# Patient Record
Sex: Female | Born: 1970 | Race: White | Hispanic: No | Marital: Married | Smoking: Former smoker
Health system: Southern US, Community
[De-identification: ages and names within clinical notes are randomized; demographics above are authoritative.]

## PROBLEM LIST (undated history)

## (undated) HISTORY — PX: OTHER SURGICAL HISTORY: SHX169

---

## 2000-08-21 ENCOUNTER — Encounter (INDEPENDENT_AMBULATORY_CARE_PROVIDER_SITE_OTHER): Payer: Self-pay | Admitting: Specialist

## 2000-08-21 ENCOUNTER — Inpatient Hospital Stay (HOSPITAL_COMMUNITY): Admission: AD | Admit: 2000-08-21 | Discharge: 2000-08-23 | Payer: Self-pay | Admitting: Obstetrics and Gynecology

## 2000-10-02 ENCOUNTER — Other Ambulatory Visit: Admission: RE | Admit: 2000-10-02 | Discharge: 2000-10-02 | Payer: Self-pay | Admitting: Obstetrics and Gynecology

## 2007-10-23 ENCOUNTER — Ambulatory Visit: Payer: Self-pay | Admitting: Family Medicine

## 2007-10-23 ENCOUNTER — Encounter: Payer: Self-pay | Admitting: Family Medicine

## 2007-10-23 ENCOUNTER — Other Ambulatory Visit: Admission: RE | Admit: 2007-10-23 | Discharge: 2007-10-23 | Payer: Self-pay | Admitting: Family Medicine

## 2007-10-28 LAB — CONVERTED CEMR LAB: Pap Smear: NORMAL

## 2007-11-04 ENCOUNTER — Ambulatory Visit: Payer: Self-pay | Admitting: Family Medicine

## 2007-11-04 DIAGNOSIS — L255 Unspecified contact dermatitis due to plants, except food: Secondary | ICD-10-CM | POA: Insufficient documentation

## 2008-11-04 ENCOUNTER — Other Ambulatory Visit: Admission: RE | Admit: 2008-11-04 | Discharge: 2008-11-04 | Payer: Self-pay | Admitting: Family Medicine

## 2008-11-04 ENCOUNTER — Ambulatory Visit: Payer: Self-pay | Admitting: Family Medicine

## 2008-11-04 ENCOUNTER — Encounter: Payer: Self-pay | Admitting: Family Medicine

## 2008-11-04 DIAGNOSIS — L708 Other acne: Secondary | ICD-10-CM

## 2009-07-17 ENCOUNTER — Ambulatory Visit: Payer: Self-pay | Admitting: Family Medicine

## 2009-07-17 DIAGNOSIS — J069 Acute upper respiratory infection, unspecified: Secondary | ICD-10-CM | POA: Insufficient documentation

## 2009-12-14 ENCOUNTER — Telehealth (INDEPENDENT_AMBULATORY_CARE_PROVIDER_SITE_OTHER): Payer: Self-pay | Admitting: *Deleted

## 2009-12-20 ENCOUNTER — Other Ambulatory Visit: Admission: RE | Admit: 2009-12-20 | Discharge: 2009-12-20 | Payer: Self-pay | Admitting: Family Medicine

## 2009-12-20 ENCOUNTER — Ambulatory Visit: Payer: Self-pay | Admitting: Family Medicine

## 2009-12-20 DIAGNOSIS — D692 Other nonthrombocytopenic purpura: Secondary | ICD-10-CM | POA: Insufficient documentation

## 2010-01-18 ENCOUNTER — Encounter: Admission: RE | Admit: 2010-01-18 | Discharge: 2010-01-18 | Payer: Self-pay | Admitting: Family Medicine

## 2010-09-22 NOTE — Assessment & Plan Note (Signed)
Summary: CPE with pap   Vital Signs:  Patient profile:   40 year old female Menstrual status:  regular LMP:     12/14/2009 Height:      66.25 inches Weight:      148 pounds BMI:     23.79 O2 Sat:      99 % on Room air Pulse rate:   101 / minute BP sitting:   122 / 86  (left arm) Cuff size:   regular  Vitals Entered By: Payton Spark CMA (Dec 20, 2009 3:45 PM)  O2 Flow:  Room air CC: CPE w/ pap LMP (date): 12/14/2009     Enter LMP: 12/14/2009 Last PAP Result NEGATIVE FOR INTRAEPITHELIAL LESIONS OR MALIGNANCY.   Primary Care Provider:  Seymour Bars DO  CC:  CPE w/ pap.  History of Present Illness: 40 yo WF presents for CPE with pap.  On Microgrestin for birth control.  She is G1P1, pre-menopausal.  Doing well.  Married, monogamous.  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  She is due for her first mammogram.  Denies fam hx of cardiovascular dz, colon or breast cancer.  She is due for fasting labs.  Her Tdap was updated in 2009.    Current Medications (verified): 1)  Microgestin 1.5/30 1.5-30 Mg-Mcg  Tabs (Norethindrone Acet-Ethinyl Est) .... Take 1 Tablet By Mouth Once A Day 2)  Childrens Multivitamin 60 Mg Chew (Pediatric Multivit-Minerals-C) .... Take 1 Tablet By Mouth Once A Day  Allergies (verified): No Known Drug Allergies  Past History:  Past Medical History: Reviewed history from 10/23/2007 and no changes required. G1P1  Past Surgical History: Reviewed history from 10/23/2007 and no changes required. none  Family History: Reviewed history from 10/23/2007 and no changes required. mother alive, healthy father died in MVA sister anxiety MGM DM  Social History: Reviewed history from 07/17/2009 and no changes required. Working as Lawyer. Married to Jonny Ruiz and has one daughter, 82 yo.   Quit smoking 2005. Rare ETOH. No regular exercise. 6 caffeine/ day, fair diet  Review of Systems  The patient denies anorexia, fever, weight loss,  weight gain, vision loss, decreased hearing, hoarseness, chest pain, syncope, dyspnea on exertion, peripheral edema, prolonged cough, headaches, hemoptysis, abdominal pain, melena, hematochezia, severe indigestion/heartburn, hematuria, incontinence, genital sores, muscle weakness, suspicious skin lesions, transient blindness, difficulty walking, depression, unusual weight change, abnormal bleeding, enlarged lymph nodes, angioedema, breast masses, and testicular masses.    Physical Exam  General:  alert, well-developed, well-nourished, and well-hydrated.   Head:  normocephalic and atraumatic.   Eyes:  pupils equal, pupils round, and pupils reactive to light.   Ears:  no external deformities.   Nose:  no nasal discharge.   Mouth:  good dentition and pharynx pink and moist.   Neck:  no masses.   Breasts:  No mass, nodules, thickening, tenderness, bulging, retraction, inflamation, nipple discharge or skin changes noted.   Lungs:  Normal respiratory effort, chest expands symmetrically. Lungs are clear to auscultation, no crackles or wheezes. Heart:  Normal rate and regular rhythm. S1 and S2 normal without gallop, murmur, click, rub or other extra sounds. Abdomen:  Bowel sounds positive,abdomen soft and non-tender without masses, organomegaly Genitalia:  Pelvic Exam:        External: normal female genitalia without lesions or masses        Vagina: normal without lesions or masses        Cervix: normal without lesions or masses  Adnexa: normal bimanual exam without masses or fullness        Uterus: normal by palpation        Pap smear: performed Pulses:  2+ radial and pedal pulses Extremities:  no LE edema Skin:  color normal and no rashes.   Cervical Nodes:  No lymphadenopathy noted Psych:  good eye contact, not anxious appearing, and not depressed appearing.     Impression & Recommendations:  Problem # 1:  ROUTINE GYNECOLOGICAL EXAMINATION (ICD-V72.31) Keeping healthy checklist for  women reviewed. BP at goal.  BMI at goal. Thin prep pap today. Update fsting labs. Update mammogram. RFd BCPs.  Complete Medication List: 1)  Microgestin 1.5/30 1.5-30 Mg-mcg Tabs (Norethindrone acet-ethinyl est) .... Take 1 tablet by mouth once a day 2)  Childrens Multivitamin 60 Mg Chew (Pediatric multivit-minerals-c) .... Take 1 tablet by mouth once a day  Other Orders: T-Mammography Bilateral Screening (16109) T-CBC No Diff ([id number]) T-Comprehensive Metabolic Panel ([id number]) T-Lipid Profile ([id number]) Prescriptions: MICROGESTIN 1.5/30 1.5-30 MG-MCG  TABS (NORETHINDRONE ACET-ETHINYL EST) Take 1 tablet by mouth once a day  #28 Tablet x 12   Entered and Authorized by:   Seymour Bars DO   Signed by:   Seymour Bars DO on 12/20/2009   Method used:   [facility]ronically to        [phone] (retail)       [address]       [place], [place]  [address]       Ph: [phone] or [phone]       Fax: [phone]   RxID:   [id number]

## 2010-09-22 NOTE — Progress Notes (Signed)
Summary: refill request for Birth Control  Phone Note Refill Request Message from:  Patient on December 14, 2009 11:20 AM  Refills Requested: Medication #1:  MICROGESTIN 1.5/30 1.5-30 MG-MCG  TABS Take 1 tablet by mouth once a day   Dosage confirmed as above?Dosage Confirmed [facility], she is coming for her pap on 12/20/09 but runs out of birth control before then. Thanks  Initial call taken by: Michaelle Copas,  December 14, 2009 11:21 AM    Prescriptions: MICROGESTIN 1.5/30 1.5-30 MG-MCG  TABS (NORETHINDRONE ACET-ETHINYL EST) Take 1 tablet by mouth once a day  #28 Tablet x 0   Entered by:   Payton Spark CMA   Authorized by:   Seymour Bars DO   Signed by:   Payton Spark CMA on 12/14/2009   Method used:   Electronically to        [facility] [phone]* (retail)       [address]       [place], [place]  [address]       Ph: [phone] or [phone]       Fax: [phone]   RxID:   [id number]

## 2010-12-19 ENCOUNTER — Other Ambulatory Visit: Payer: Self-pay | Admitting: Family Medicine

## 2010-12-19 NOTE — Telephone Encounter (Signed)
Pt called and wants refill of OCP pill. PLAN:  Pt notified and told needs to come in for CPE.  LMOM to call and schedule, and cannot refill any OCp med until done.

## 2011-02-07 ENCOUNTER — Encounter: Payer: Self-pay | Admitting: Family Medicine

## 2011-02-08 ENCOUNTER — Encounter: Payer: Self-pay | Admitting: Family Medicine

## 2011-03-01 ENCOUNTER — Ambulatory Visit (INDEPENDENT_AMBULATORY_CARE_PROVIDER_SITE_OTHER): Payer: BC Managed Care – PPO | Admitting: Family Medicine

## 2011-03-01 ENCOUNTER — Encounter: Payer: Self-pay | Admitting: Family Medicine

## 2011-03-01 DIAGNOSIS — Z1329 Encounter for screening for other suspected endocrine disorder: Secondary | ICD-10-CM

## 2011-03-01 DIAGNOSIS — Z8349 Family history of other endocrine, nutritional and metabolic diseases: Secondary | ICD-10-CM

## 2011-03-01 DIAGNOSIS — Z0000 Encounter for general adult medical examination without abnormal findings: Secondary | ICD-10-CM

## 2011-03-01 DIAGNOSIS — Z1231 Encounter for screening mammogram for malignant neoplasm of breast: Secondary | ICD-10-CM

## 2011-03-01 DIAGNOSIS — Z13220 Encounter for screening for lipoid disorders: Secondary | ICD-10-CM

## 2011-03-01 MED ORDER — NORETHINDRONE ACET-ETHINYL EST 1.5-30 MG-MCG PO TABS: ORAL_TABLET | ORAL | Status: DC

## 2011-03-01 NOTE — Progress Notes (Signed)
  Subjective:    Patient ID: Mary Fowler, female    DOB: [birth year], 40 y.o.   MRN: [id number]  HPI  39 yo WF presents for CPE with pap smear.  She is on Junel for birth control.  Having some hot flashes and nightsweats.  Regular menses.  Her tetanus vaccine is UTD.  Her mammogram is due.  No fam hx of premature heart dz or colon cancer.  She does not smoke.  No fam hx of breast cancer.  She takes a MVI.  She stays physically active.  Due to RF her OCP.  Due for fasting labs.  Had a normal pap smear last year.  No hx of abnormals.  Due to repeat next yr.    BP 150/85  Pulse 100  Ht 5' 6.25" (1.683 m)  Wt 144 lb (65.318 kg)  BMI 23.07 kg/m2  SpO2 99%  LMP 02/19/2011   Review of Systems  Constitutional: Negative for fatigue and unexpected weight change.  Eyes: Negative for visual disturbance.  Respiratory: Negative for shortness of breath.   Cardiovascular: Negative for chest pain, palpitations and leg swelling.  Gastrointestinal: Negative for abdominal pain, diarrhea, constipation and blood in stool.  Genitourinary: Negative for dysuria and vaginal discharge.  Neurological: Negative for headaches.  Psychiatric/Behavioral: Negative for sleep disturbance and dysphoric mood.       Objective:   Physical Exam  Constitutional: She appears well-developed and well-nourished. No distress.  HENT:  Mouth/Throat: Oropharynx is clear and moist.  Eyes: Conjunctivae are normal.  Neck: Neck supple. No thyromegaly present.  Cardiovascular: Normal rate, regular rhythm and normal heart sounds.   No murmur heard. Pulmonary/Chest: Effort normal and breath sounds normal.  Abdominal: Soft. Bowel sounds are normal. She exhibits no distension. There is no tenderness. There is no guarding.       No HSM  Musculoskeletal: She exhibits no edema.  Skin: Skin is warm.  Psychiatric: She has a normal mood and affect.          Assessment & Plan:  Assesment:  1. CPE- Keeping healthy checklist for  women reviewed today.  BP at goal.  BMI 23  in the normal range.     Labs ordered even though she is nonfasting. Contraception- continue Junel, RFd. Last pap 12-2009, recheck in 1 yr. Tdap is UTD. Mammogram ordered. Encouraged healthy diet, regular exercise, MVI daily.  Add Calcium with D daily. Return for next physical in 1 yr.

## 2011-03-01 NOTE — Patient Instructions (Signed)
Labs due.  Check today. Will call you w/ results tomorrow.  Return for f/u in 1 yr, sooner if needed.  Call [phone] and ask for Kathryne Sharper to update your mammogram.

## 2011-03-02 ENCOUNTER — Telehealth: Payer: Self-pay | Admitting: Family Medicine

## 2011-03-02 LAB — COMPLETE METABOLIC PANEL WITH GFR
AST: 14 U/L (ref 0–37)
Albumin: 4.5 g/dL (ref 3.5–5.2)
Alkaline Phosphatase: 34 U/L — ABNORMAL LOW (ref 39–117)
BUN: 7 mg/dL (ref 6–23)
CO2: 26 mEq/L (ref 19–32)
Creat: 0.84 mg/dL (ref 0.50–1.10)
GFR, Est African American: >60 mL/min (ref >60)
Glucose, Bld: 100 mg/dL — ABNORMAL HIGH (ref 70–99)

## 2011-03-02 LAB — TSH: TSH: 1.892 u[IU]/mL (ref 0.350–4.500)

## 2011-03-02 NOTE — Telephone Encounter (Signed)
Pt aware of the above  

## 2011-03-02 NOTE — Telephone Encounter (Signed)
Pls let pt know that her sugar, liver and kidney function came back normal.  Her LDL bad cholesterol (was non fasting) was high at 157.  I'd recommend trying 2 grams of Omega 3 Fish Oil/ day and regular exercise and repeating a full cholesterol panel in 3-4 mos.

## 2011-03-09 ENCOUNTER — Other Ambulatory Visit: Payer: Self-pay | Admitting: Family Medicine

## 2011-05-02 ENCOUNTER — Inpatient Hospital Stay: Admission: RE | Admit: 2011-05-02 | Payer: BC Managed Care – PPO | Source: Ambulatory Visit

## 2011-05-02 ENCOUNTER — Ambulatory Visit
Admission: RE | Admit: 2011-05-02 | Discharge: 2011-05-02 | Disposition: A | Discharge status: Home / Self Care | Payer: BC Managed Care – PPO | Source: Ambulatory Visit | Attending: Family Medicine | Admitting: Family Medicine

## 2011-05-02 DIAGNOSIS — Z1231 Encounter for screening mammogram for malignant neoplasm of breast: Secondary | ICD-10-CM

## 2012-02-07 ENCOUNTER — Other Ambulatory Visit: Payer: Self-pay | Admitting: *Deleted

## 2012-02-07 MED ORDER — NORETHINDRONE ACET-ETHINYL EST 1.5-30 MG-MCG PO TABS: 1.0000 | ORAL_TABLET | Freq: Every day | ORAL | Status: DC

## 2012-03-11 ENCOUNTER — Other Ambulatory Visit: Payer: Self-pay | Admitting: Family Medicine

## 2012-03-12 ENCOUNTER — Other Ambulatory Visit: Payer: Self-pay | Admitting: *Deleted

## 2012-03-12 MED ORDER — NORETHINDRONE ACET-ETHINYL EST 1.5-30 MG-MCG PO TABS: 1.0000 | ORAL_TABLET | Freq: Every day | ORAL | Status: DC

## 2012-04-01 ENCOUNTER — Encounter: Payer: BC Managed Care – PPO | Admitting: Family Medicine

## 2012-04-05 ENCOUNTER — Encounter: Payer: Self-pay | Admitting: Family Medicine

## 2012-04-05 ENCOUNTER — Other Ambulatory Visit (HOSPITAL_COMMUNITY)
Admission: RE | Admit: 2012-04-05 | Discharge: 2012-04-05 | Disposition: A | Discharge status: Home / Self Care | Payer: BC Managed Care – PPO | Source: Ambulatory Visit | Attending: Family Medicine | Admitting: Family Medicine

## 2012-04-05 ENCOUNTER — Ambulatory Visit (INDEPENDENT_AMBULATORY_CARE_PROVIDER_SITE_OTHER): Payer: BC Managed Care – PPO | Admitting: Family Medicine

## 2012-04-05 VITALS — BP 135/90 | HR 85 | Wt 146.0 lb

## 2012-04-05 DIAGNOSIS — E785 Hyperlipidemia, unspecified: Secondary | ICD-10-CM

## 2012-04-05 DIAGNOSIS — Z1231 Encounter for screening mammogram for malignant neoplasm of breast: Secondary | ICD-10-CM

## 2012-04-05 DIAGNOSIS — Z01419 Encounter for gynecological examination (general) (routine) without abnormal findings: Secondary | ICD-10-CM

## 2012-04-05 DIAGNOSIS — Z1151 Encounter for screening for human papillomavirus (HPV): Secondary | ICD-10-CM | POA: Insufficient documentation

## 2012-04-05 DIAGNOSIS — E782 Mixed hyperlipidemia: Secondary | ICD-10-CM | POA: Insufficient documentation

## 2012-04-05 MED ORDER — NORETHINDRONE ACET-ETHINYL EST 1.5-30 MG-MCG PO TABS: ORAL_TABLET | ORAL | Status: DC

## 2012-04-05 NOTE — Patient Instructions (Addendum)
Start a regular exercise program and make sure you are eating a healthy diet Try to eat 4 servings of dairy a day Tdap given today.

## 2012-04-05 NOTE — Progress Notes (Signed)
  Subjective:     Mary Fowler is a 41 y.o. female and is here for a comprehensive physical exam. The patient reports no problems.  History   Social History  . Marital Status: Married    Spouse Name: N/A    Number of Children: 1  . Years of Education: N/A   Occupational History  . realestate paralegal.      Works on [employer] for [employer].    Social History Main Topics  . Smoking status: Former Smoker    Quit date: 08/22/2003  . Smokeless tobacco: Not on file  . Alcohol Use: Yes     rare  . Drug Use: No  . 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   Other Topics Concern  . Not on file   Social History Narrative   Walks for exercise. Swimming.     Health Maintenance  Topic Date Due  . Tetanus/tdap  08/22/2011  . Influenza Vaccine  05/21/2012  . Pap Smear  04/06/2015    The following portions of the patient's history were reviewed and updated as appropriate: allergies, current medications, past family history, past medical history, past social history, past surgical history and problem list.  Review of Systems A comprehensive review of systems was negative.   Objective:    BP 135/90  Pulse 85  Wt 146 lb (66.225 kg)  SpO2 96%  LMP 03/22/2012 General appearance: alert, cooperative and appears stated age Head: Normocephalic, without obvious abnormality, atraumatic Eyes: conj clear, EOMi, PEERLA Ears: normal TM's and external ear canals both ears Nose: Nares normal. Septum midline. Mucosa normal. No drainage or sinus tenderness. Throat: lips, mucosa, and tongue normal; teeth and gums normal Neck: no adenopathy, no carotid bruit, supple, symmetrical, trachea midline and thyroid not enlarged, symmetric, no tenderness/mass/nodules Back: symmetric, no curvature. ROM normal. No CVA tenderness. Lungs: clear to auscultation bilaterally Breasts: normal appearance, no masses or tenderness, breast  ridge on the right is more prominent then on the left.   Heart: regular rate and rhythm, S1, S2 normal, no murmur, click, rub or gallop Abdomen: soft, non-tender; bowel sounds normal; no masses,  no organomegaly Pelvic: cervix normal in appearance, external genitalia normal, no adnexal masses or tenderness, no cervical motion tenderness, rectovaginal septum normal, uterus normal size, shape, and consistency and vagina normal without discharge Extremities: extremities normal, atraumatic, no cyanosis or edema Pulses: 2+ and symmetric Skin: Skin color, texture, turgor normal. No rashes or lesions Lymph nodes: Cervical, supraclavicular, and axillary nodes normal. Neurologic: Grossly normal    Assessment:    Healthy female exam.      Plan:     See After Visit Summary for Counseling Recommendations  Start a regular exercise program and make sure you are eating a healthy diet Try to eat 4 servings of dairy a day or take a calcium supplement (500mg  twice a day). Your vaccines are up to date.  Due for Tdap today Due for screening mammogram Due for CMp, lipids. Will check TSH bc of family hx.

## 2012-05-07 ENCOUNTER — Ambulatory Visit: Payer: BC Managed Care – PPO

## 2012-05-07 ENCOUNTER — Telehealth: Payer: Self-pay | Admitting: Family Medicine

## 2012-05-07 DIAGNOSIS — N63 Unspecified lump in unspecified breast: Secondary | ICD-10-CM

## 2012-05-07 NOTE — Telephone Encounter (Signed)
Order placed

## 2012-05-07 NOTE — Telephone Encounter (Signed)
Imaging downstairs called and states that this patient needs a diagnostic mammo order and it will have to be done at [facility] [place].[name] Thanks, Victorino Dike

## 2012-05-08 ENCOUNTER — Other Ambulatory Visit: Payer: Self-pay | Admitting: Family Medicine

## 2012-05-08 DIAGNOSIS — N63 Unspecified lump in unspecified breast: Secondary | ICD-10-CM

## 2012-05-29 ENCOUNTER — Ambulatory Visit
Admission: RE | Admit: 2012-05-29 | Discharge: 2012-05-29 | Disposition: A | Discharge status: Home / Self Care | Payer: BC Managed Care – PPO | Source: Ambulatory Visit | Attending: Family Medicine | Admitting: Family Medicine

## 2012-05-29 DIAGNOSIS — N63 Unspecified lump in unspecified breast: Secondary | ICD-10-CM

## 2017-10-20 ENCOUNTER — Emergency Department (INDEPENDENT_AMBULATORY_CARE_PROVIDER_SITE_OTHER)
Admission: EM | Admit: 2017-10-20 | Discharge: 2017-10-20 | Disposition: A | Discharge status: Home / Self Care | Payer: No Typology Code available for payment source | Source: Home / Self Care | Attending: Family Medicine | Admitting: Family Medicine

## 2017-10-20 ENCOUNTER — Encounter: Payer: Self-pay | Admitting: Emergency Medicine

## 2017-10-20 DIAGNOSIS — L237 Allergic contact dermatitis due to plants, except food: Secondary | ICD-10-CM | POA: Diagnosis not present

## 2017-10-20 DIAGNOSIS — L255 Unspecified contact dermatitis due to plants, except food: Secondary | ICD-10-CM

## 2017-10-20 MED ORDER — TRIAMCINOLONE ACETONIDE 40 MG/ML IJ SUSP
40.0000 mg | Freq: Once | INTRAMUSCULAR | Status: AC
  Administered 2017-10-20: 40 mg via INTRAMUSCULAR

## 2017-10-20 NOTE — Discharge Instructions (Signed)
May continue Benadryl at bedtime for itching.  May take nonsedating antihistamine such as Zyrtec for itchng daytime.  May apply 1% hydrocortisone cream 2 or 3 times daily.

## 2017-10-20 NOTE — ED Provider Notes (Signed)
[facility]    CSN: [id number] Arrival date & time: 10/20/17  0943     History   Chief Complaint Chief Complaint  Patient presents with  . Rash    HPI Mary Fowler is a 47 y.o. female.   Patient came into contact with poison ivy vines about 6 days ago, and subsequently developed pruritic rash on her right neck and upper chest.   The history is provided by the patient.  Rash  Location: Neck and upper chest. Quality: dryness, itchiness, redness and swelling   Quality: not blistering, not bruising, not burning, not draining, not painful, not peeling and not weeping   Severity:  Mild Onset quality:  Sudden Duration:  6 days Timing:  Constant Progression:  Worsening Chronicity:  New Context: plant contact   Relieved by:  None tried Worsened by:  Heat Ineffective treatments:  None tried Associated symptoms: no fatigue, no fever, no induration, no joint pain, no myalgias, no nausea and no sore throat     History reviewed. No pertinent past medical history.  Patient Active Problem List   Diagnosis Date Noted  . Other and unspecified hyperlipidemia 04/05/2012  . 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 ACNE VULGARIS 11/04/2008    History reviewed. No pertinent surgical history.  OB History    No data available       Home Medications    Prior to Admission medications   Not on File    Family History Family History  Problem Relation Age of Onset  . Hypothyroidism Mother   . Hyperlipidemia Mother   . Anxiety disorder Sister   . Diabetes Maternal Grandmother     Social History Social History   Tobacco Use  . Smoking status: Former Smoker    Last attempt to quit: 08/22/2003    Years since quitting: 14.1  . Smokeless tobacco: Never Used  Substance Use Topics  . Alcohol use: Yes    Comment: rare  . Drug use: No     Allergies   Patient has no known allergies.   Review of Systems Review of Systems  Constitutional: Negative  for fatigue and fever.  HENT: Negative for sore throat.   Gastrointestinal: Negative for nausea.  Musculoskeletal: Negative for arthralgias and myalgias.  Skin: Positive for rash.     Physical Exam Triage Vital Signs ED Triage Vitals [10/20/17 1005]  Enc Vitals Group     BP 127/87     Pulse Rate 82     Resp      Temp 98.2 F (36.8 C)     Temp Source Oral     SpO2 97 %     Weight 153 lb (69.4 kg)     Height 5\' 7"  (1.702 m)     Head Circumference      Peak Flow      Pain Score 0     Pain Loc      Pain Edu?      Excl. in GC?    No data found.  Updated Vital Signs BP 127/87 (BP Location: Right Arm)   Pulse 82   Temp 98.2 F (36.8 C) (Oral)   Ht 5\' 7"  (1.702 m)   Wt 153 lb (69.4 kg)   LMP 10/09/2017 (Exact Date)   SpO2 97%   BMI 23.96 kg/m   Visual Acuity Right Eye Distance:   Left Eye Distance:   Bilateral Distance:    Right Eye Near:   Left Eye Near:  Bilateral Near:     Physical Exam  Constitutional: She appears well-developed and well-nourished. No distress.  HENT:  Head: Normocephalic.  Right Ear: External ear normal.  Left Ear: External ear normal.  Nose: Nose normal.  Mouth/Throat: Oropharynx is clear and moist.  Eyes: Conjunctivae are normal. Pupils are equal, round, and reactive to light.  Neck: Normal range of motion.    Erythematous maculo-papular eruption right neck and upper chest consistent with rhus dermatitis.  No evidence cellulitis.  Cardiovascular: Normal rate.  Pulmonary/Chest: Effort normal.  Neurological: She is alert.  Skin: Skin is warm and dry.     Nursing note and vitals reviewed.    UC Treatments / Results  Labs (all labs ordered are listed, but only abnormal results are displayed) Labs Reviewed - No data to display  EKG  EKG Interpretation None       Radiology No results found.  Procedures Procedures (including critical care time)  Medications Ordered in UC Medications  triamcinolone acetonide  (KENALOG-40) injection 40 mg (not administered)     Initial Impression / Assessment and Plan / UC Course  I have reviewed the triage vital signs and the nursing notes.  Pertinent labs & imaging results that were available during my care of the patient were reviewed by me and considered in my medical decision making (see chart for details).    Administered Kenalog 40mg  IM. May continue Benadryl at bedtime for itching.  May take nonsedating antihistamine such as Zyrtec for itchng daytime.  May apply 1% hydrocortisone cream 2 or 3 times daily. Followup with Family Doctor if not improved in about 5 days.    Final Clinical Impressions(s) / UC Diagnoses   Final diagnoses:  Rhus dermatitis    ED Discharge Orders    None           Lattie Haw, MD 10/20/17 1017

## 2017-10-20 NOTE — ED Triage Notes (Signed)
Patient complaining of possible poison oak on neck and chest x 3 days, itching.

## 2018-10-22 ENCOUNTER — Emergency Department
Admission: EM | Admit: 2018-10-22 | Discharge: 2018-10-22 | Disposition: A | Discharge status: Home / Self Care | Payer: No Typology Code available for payment source | Source: Home / Self Care

## 2018-10-22 ENCOUNTER — Other Ambulatory Visit: Payer: Self-pay

## 2018-10-22 ENCOUNTER — Encounter: Payer: Self-pay | Admitting: Emergency Medicine

## 2018-10-22 DIAGNOSIS — J029 Acute pharyngitis, unspecified: Secondary | ICD-10-CM | POA: Diagnosis not present

## 2018-10-22 DIAGNOSIS — J069 Acute upper respiratory infection, unspecified: Secondary | ICD-10-CM

## 2018-10-22 DIAGNOSIS — B9789 Other viral agents as the cause of diseases classified elsewhere: Secondary | ICD-10-CM

## 2018-10-22 LAB — POCT INFLUENZA A/B
Influenza A, POC: NEGATIVE
Influenza B, POC: NEGATIVE

## 2018-10-22 LAB — POCT RAPID STREP A (OFFICE): Rapid Strep A Screen: NEGATIVE

## 2018-10-22 MED ORDER — AZITHROMYCIN 250 MG PO TABS: 250.0000 mg | ORAL_TABLET | Freq: Every day | ORAL | 0 refills | Status: DC

## 2018-10-22 NOTE — Discharge Instructions (Addendum)
°  Your symptoms are likely due to a virus such as the common cold, however, if you developing worsening chest congestion with shortness of breath, persistent fever for 3 days, or symptoms not improving in 4-5 days, you may fill the antibiotic (azithromycin).  If you do fill the antibiotic,  please take antibiotics as prescribed and be sure to complete entire course even if you start to feel better to ensure infection does not come back. You may take 500mg  acetaminophen every 4-6 hours or in combination with ibuprofen 400-600mg  every 6-8 hours as needed for pain, inflammation, and fever.  Be sure to well hydrated with clear liquids and get at least 8 hours of sleep at night, preferably more while sick.   Please follow up with family medicine in 1 week if needed.

## 2018-10-22 NOTE — ED Provider Notes (Signed)
[facility]    CSN: [id number] Arrival date & time: 10/22/18  1427     History   Chief Complaint Chief Complaint  Patient presents with  . Headache    HPI Mary Fowler is a 48 y.o. female.   HPI  Mary Fowler is a 48 y.o. female presenting to UC with c/o generalized HA, subjective fever, night sweats and body aches since yesterday.  Associated sore throat with mild cough and mild congestion. She was exposed to the flu at work by several co-workers 1-2 weeks ago.  She did not receive the flu vaccine.  She would like to be tested for flu, she does not want to try Tamiflu unless she tests positive.  She has taken ibuprofen, last dose at 1PM. Mild relief. Denies chest pain or SOB.    History reviewed. No pertinent past medical history.  Patient Active Problem List   Diagnosis Date Noted  . Other and unspecified hyperlipidemia 04/05/2012  . 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 ACNE VULGARIS 11/04/2008    History reviewed. No pertinent surgical history.  OB History   No obstetric history on file.      Home Medications    Prior to Admission medications   Medication Sig Start Date End Date Taking? Authorizing Provider  ibuprofen (ADVIL,MOTRIN) 800 MG tablet Take 800 mg by mouth every 8 (eight) hours as needed.   Yes [provider]  azithromycin (ZITHROMAX) 250 MG tablet Take 1 tablet (250 mg total) by mouth daily. Take first 2 tablets together, then 1 every day until finished. 10/22/18   Lurene ShadowPhelps, Debe Anfinson O, PA-C    Family History Family History  Problem Relation Age of Onset  . Hypothyroidism Mother   . Hyperlipidemia Mother   . Anxiety disorder Sister   . Diabetes Maternal Grandmother     Social History Social History   Tobacco Use  . Smoking status: Former Smoker    Last attempt to quit: 08/22/2003    Years since quitting: 15.1  . Smokeless tobacco: Never Used  Substance Use Topics  . Alcohol use: Yes    Comment: rare  . Drug  use: No     Allergies   Patient has no known allergies.   Review of Systems Review of Systems  Constitutional: Positive for chills, diaphoresis, fatigue and fever.  HENT: Positive for congestion and sore throat. Negative for ear pain, trouble swallowing and voice change.   Respiratory: Positive for cough. Negative for shortness of breath.   Cardiovascular: Negative for chest pain and palpitations.  Gastrointestinal: Negative for abdominal pain, diarrhea, nausea and vomiting.  Musculoskeletal: Negative for arthralgias, back pain and myalgias.  Skin: Negative for rash.  Neurological: Positive for headaches. Negative for dizziness and light-headedness.     Physical Exam Triage Vital Signs ED Triage Vitals  Enc Vitals Group     BP 10/22/18 1447 125/87     Pulse Rate 10/22/18 1447 (!) 110     Resp --      Temp 10/22/18 1447 100.3 F (37.9 C)     Temp Source 10/22/18 1447 Oral     SpO2 10/22/18 1447 96 %     Weight 10/22/18 1448 160 lb (72.6 kg)     Height 10/22/18 1448 5\' 7"  (1.702 m)     Head Circumference --      Peak Flow --      Pain Score 10/22/18 1448 6     Pain Loc --  Pain Edu? --      Excl. in GC? --    No data found.  Updated Vital Signs BP 125/87 (BP Location: Right Arm)   Pulse (!) 110   Temp 100.3 F (37.9 C) (Oral)   Ht 5\' 7"  (1.702 m)   Wt 160 lb (72.6 kg)   SpO2 96%   BMI 25.06 kg/m   Visual Acuity Right Eye Distance:   Left Eye Distance:   Bilateral Distance:    Right Eye Near:   Left Eye Near:    Bilateral Near:     Physical Exam Vitals signs and nursing note reviewed.  Constitutional:      Appearance: She is well-developed.  HENT:     Head: Normocephalic and atraumatic.     Right Ear: Tympanic membrane normal.     Left Ear: Tympanic membrane normal.     Nose: Nose normal.     Right Sinus: No maxillary sinus tenderness or frontal sinus tenderness.     Left Sinus: No maxillary sinus tenderness or frontal sinus tenderness.      Mouth/Throat:     Lips: Pink.     Mouth: Mucous membranes are moist. Oral lesions present.     Pharynx: Oropharynx is clear. Uvula midline.      Comments: Shallow erythematous sores on roofs of mouth. Neck:     Musculoskeletal: Normal range of motion and neck supple.  Cardiovascular:     Rate and Rhythm: Regular rhythm. Tachycardia present.  Pulmonary:     Effort: Pulmonary effort is normal.     Breath sounds: Normal breath sounds.  Musculoskeletal: Normal range of motion.  Lymphadenopathy:     Cervical: No cervical adenopathy.  Skin:    General: Skin is warm and dry.  Neurological:     Mental Status: She is alert and oriented to person, place, and time.     GCS: GCS eye subscore is 4. GCS verbal subscore is 5. GCS motor subscore is 6.  Psychiatric:        Behavior: Behavior normal.      UC Treatments / Results  Labs (all labs ordered are listed, but only abnormal results are displayed) Labs Reviewed  STREP A DNA PROBE  POCT RAPID STREP A (OFFICE)  POCT INFLUENZA A/B    EKG None  Radiology No results found.  Procedures Procedures (including critical care time)  Medications Ordered in UC Medications - No data to display  Initial Impression / Assessment and Plan / UC Course  I have reviewed the triage vital signs and the nursing notes.  Pertinent labs & imaging results that were available during my care of the patient were reviewed by me and considered in my medical decision making (see chart for details).     Rapid strep and flu: NEGATIVE Pt does have sore on the roof of her mouth but denies pain. Sores are likely related to viral illness causing URI symptoms HA likely from low-grade fever, encouraged good hydration. Discussed ordering CXR today, pt declined.  No hx of pneumonia.  Prescription to hold with expiration date for azithromycin. Pt to fill if persistent fever develops or not improving in 1 week.   Final Clinical Impressions(s) / UC Diagnoses    Final diagnoses:  Sore throat  Viral upper respiratory tract infection with cough     Discharge Instructions      Your symptoms are likely due to a virus such as the common cold, however, if you developing worsening chest congestion with  shortness of breath, persistent fever for 3 days, or symptoms not improving in 4-5 days, you may fill the antibiotic (azithromycin).  If you do fill the antibiotic,  please take antibiotics as prescribed and be sure to complete entire course even if you start to feel better to ensure infection does not come back. You may take 500mg  acetaminophen every 4-6 hours or in combination with ibuprofen 400-600mg  every 6-8 hours as needed for pain, inflammation, and fever.  Be sure to well hydrated with clear liquids and get at least 8 hours of sleep at night, preferably more while sick.   Please follow up with family medicine in 1 week if needed.     ED Prescriptions    Medication Sig Dispense Auth. Provider   azithromycin (ZITHROMAX) 250 MG tablet Take 1 tablet (250 mg total) by mouth daily. Take first 2 tablets together, then 1 every day until finished. 6 tablet Lurene Shadow, PA-C     Controlled Substance Prescriptions [place] Controlled Substance Registry consulted? Not Applicable   Lurene Shadow, PA-C 10/22/18 1640

## 2018-10-22 NOTE — ED Triage Notes (Signed)
Headache, fever, night sweats,body aches, exposure to flu. Started yesterday

## 2018-10-23 ENCOUNTER — Telehealth: Payer: Self-pay | Admitting: *Deleted

## 2018-10-23 LAB — STREP A DNA PROBE: Group A Strep Probe: NOT DETECTED

## 2018-10-23 NOTE — Telephone Encounter (Signed)
Patient given throat culture results. She is improving.

## 2019-07-30 ENCOUNTER — Encounter: Payer: Self-pay | Admitting: Physician Assistant

## 2019-07-30 ENCOUNTER — Other Ambulatory Visit: Payer: Self-pay

## 2019-07-30 ENCOUNTER — Ambulatory Visit (INDEPENDENT_AMBULATORY_CARE_PROVIDER_SITE_OTHER): Payer: No Typology Code available for payment source | Admitting: Physician Assistant

## 2019-07-30 ENCOUNTER — Other Ambulatory Visit (HOSPITAL_COMMUNITY)
Admission: RE | Admit: 2019-07-30 | Discharge: 2019-07-30 | Disposition: A | Discharge status: Home / Self Care | Payer: No Typology Code available for payment source | Source: Ambulatory Visit | Attending: Physician Assistant | Admitting: Physician Assistant

## 2019-07-30 VITALS — BP 123/72 | HR 92 | Ht 66.5 in | Wt 158.0 lb

## 2019-07-30 DIAGNOSIS — N938 Other specified abnormal uterine and vaginal bleeding: Secondary | ICD-10-CM

## 2019-07-30 DIAGNOSIS — Z1231 Encounter for screening mammogram for malignant neoplasm of breast: Secondary | ICD-10-CM

## 2019-07-30 DIAGNOSIS — Z0000 Encounter for general adult medical examination without abnormal findings: Secondary | ICD-10-CM

## 2019-07-30 DIAGNOSIS — Z131 Encounter for screening for diabetes mellitus: Secondary | ICD-10-CM

## 2019-07-30 DIAGNOSIS — Z13220 Encounter for screening for lipoid disorders: Secondary | ICD-10-CM

## 2019-07-30 DIAGNOSIS — Z1211 Encounter for screening for malignant neoplasm of colon: Secondary | ICD-10-CM

## 2019-07-30 LAB — WET PREP FOR TRICH, YEAST, CLUE
MICRO NUMBER:: 1180283
Specimen Quality: ADEQUATE

## 2019-07-30 NOTE — Progress Notes (Signed)
Subjective:     Mary Fowler is a 48 y.o. female and is here for a comprehensive physical exam. The patient reports problems - see below.    Patient comes in today to reestablish care.  She was previously in her office back in 2013 but she has not sought any more medical care since then.  She wishes to get back on track.  She is concerned today with some abnormal uterine bleeding.  She has been having fairly regular periods monthly until November.  She had 2 periods in November and she thought she had stopped on November 20 night.  She started back a few days later and has bled until yesterday.  She has not had any bleeding today.  She denies any abdominal pain or flank pain.  She denies any pain with intercourse.  She denies any vaginal discharge.  She has noticed a bad odor in her vaginal area.  She denies any vaginal itching or burning.  She has not been on birth control since she was 38 and came off the pill.  Social History   Socioeconomic History  . Marital status: Married    Spouse name: Not on file  . Number of children: 1  . Years of education: Not on file  . Highest education level: Not on file  Occupational History  . Occupation: Careers information officerrealestate paralegal.     Employer: [employer]    Comment: Works on [employer] for [employer].   Social Needs  . Financial resource strain: Not on file  . Food insecurity    Worry: Not on file    Inability: Not on file  . Transportation needs    Medical: Not on file    Non-medical: Not on file  Tobacco Use  . Smoking status: Former Smoker    Quit date: 08/21/2001    Years since quitting: 17.9  . Smokeless tobacco: Never Used  Substance and Sexual Activity  . Alcohol use: Yes    Comment: rare  . Drug use: No  . 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  Lifestyle  . Physical activity    Days per week: Not on file    Minutes per  session: Not on file  . Stress: Not on file  Relationships  . Social Musicianconnections    Talks on phone: Not on file    Gets together: Not on file    Attends religious service: Not on file    Active member of club or organization: Not on file    Attends meetings of clubs or organizations: Not on file    Relationship status: Not on file  . Intimate partner violence    Fear of current or ex partner: Not on file    Emotionally abused: Not on file    Physically abused: Not on file    Forced sexual activity: Not on file  Other Topics Concern  . Not on file  Social History Narrative   Walks for exercise. Swimming.     Health Maintenance  Topic Date Due  . HIV Screening  05/25/1984  . PAP SMEAR-Modifier  04/06/2015  . INFLUENZA VACCINE  03/22/2019  . MAMMOGRAM  05/26/2019  . COLONOSCOPY  05/26/2019  . TETANUS/TDAP  04/05/2022    The following portions of the patient's history were reviewed and updated as appropriate: allergies, current medications, past family history, past medical history, past social  history, past surgical history and problem list.  Review of Systems Pertinent items noted in HPI and remainder of comprehensive ROS otherwise negative.   Objective:    BP 123/72   Pulse 92   Ht 5' 6.5" (1.689 m)   Wt 158 lb (71.7 kg)   SpO2 99%   BMI 25.12 kg/m  General appearance: alert, cooperative and appears stated age Head: Normocephalic, without obvious abnormality, atraumatic Eyes: conjunctivae/corneas clear. PERRL, EOM's intact. Fundi benign. Ears: normal TM's and external ear canals both ears Nose: Nares normal. Septum midline. Mucosa normal. No drainage or sinus tenderness. Throat: lips, mucosa, and tongue normal; teeth and gums normal Neck: no adenopathy, no carotid bruit, no JVD, supple, symmetrical, trachea midline and thyroid not enlarged, symmetric, no tenderness/mass/nodules Back: symmetric, no curvature. ROM normal. No CVA tenderness. Lungs: clear to  auscultation bilaterally Breasts: normal appearance, no masses or tenderness Heart: regular rate and rhythm, S1, S2 normal, no murmur, click, rub or gallop Abdomen: soft, non-tender; bowel sounds normal; no masses,  no organomegaly Pelvic: cervix normal in appearance, external genitalia normal, no adnexal masses or tenderness, no cervical motion tenderness, uterus normal size, shape, and consistency and vagina normal without discharge Extremities: extremities normal, atraumatic, no cyanosis or edema Pulses: 2+ and symmetric Skin: Skin color, texture, turgor normal. No rashes or lesions Lymph nodes: Cervical, supraclavicular, and axillary nodes normal. Neurologic: Alert and oriented X 3, normal strength and tone. Normal symmetric reflexes. Normal coordination and gait  hyper reflexive over left patella.    Assessment:    Healthy female exam.      Plan:    Marland KitchenMarland KitchenRobi was seen today for establish care and annual exam.  Diagnoses and all orders for this visit:  Routine physical examination -     Lipid Panel w/reflex Direct LDL -     COMPLETE METABOLIC PANEL WITH GFR -     TSH -     CBC -     Ferritin -     MM 3D SCREEN BREAST BILATERAL -     Ambulatory referral to Gastroenterology -     FSH/LH  Screening for diabetes mellitus -     COMPLETE METABOLIC PANEL WITH GFR  Screening for lipid disorders -     Lipid Panel w/reflex Direct LDL  DUB (dysfunctional uterine bleeding) -     COMPLETE METABOLIC PANEL WITH GFR -     TSH -     CBC -     Ferritin -     FSH/LH -     US Pelvic Complete With Transvaginal -     Cytology - PAP -     WET PREP FOR TRICH, YEAST, CLUE  Colon cancer screening -     Ambulatory referral to Gastroenterology  Visit for screening mammogram -     MM 3D SCREEN BREAST BILATERAL  .[name] Depression screen PHQ 2/9 07/30/2019  Decreased Interest 1  Down, Depressed, Hopeless 0  PHQ - 2 Score 1  Altered sleeping 1  Tired, decreased energy 1  Change in appetite 0   Feeling bad or failure about yourself  0  Trouble concentrating 0  Moving slowly or fidgety/restless 0  Suicidal thoughts 0  PHQ-9 Score 3  Difficult doing work/chores Not difficult at all   .[name] Discussed 150 minutes of exercise a week.  Encouraged vitamin D 1000 units and Calcium 1300mg  or 4 servings of dairy a day.  Fasting labs ordered today.  It has been quite a few years  since these were obtained. Mammogram ordered today.  Strongly encourage patient to get this done. Colonoscopy ordered today.  She is 48 years old and needs to start screening Pap done today.  Discussed dysfunctional uterine bleeding.  Likely this is normal variant of perimenopausal state.  I did order an FSH, TSH, CBC, CMP.  Pap done today.  Did order pelvic ultrasound to fully evaluate.  She is not bleeding currently.  In the future we could consider some progesterone if she had periods of ongoing bleeding.  We could also consider referral to GYN for ablation consideration.  See After Visit Summary for Counseling Recommendations

## 2019-07-30 NOTE — Patient Instructions (Addendum)
Dysfunctional Uterine Bleeding Dysfunctional uterine bleeding is abnormal bleeding from the uterus. Dysfunctional uterine bleeding includes:  A menstrual period that comes earlier or later than usual.  A menstrual period that is lighter or heavier than usual, or has large blood clots.  Vaginal bleeding between menstrual periods.  Skipping one or more menstrual periods.  Vaginal bleeding after sex.  Vaginal bleeding after menopause. Follow these instructions at home: Eating and drinking   Eat well-balanced meals. Include foods that are high in iron, such as liver, meat, shellfish, green leafy vegetables, and eggs.  To prevent or treat constipation, your health care provider may recommend that you: ? Drink enough fluid to keep your urine pale yellow. ? Take over-the-counter or prescription medicines. ? Eat foods that are high in fiber, such as beans, whole grains, and fresh fruits and vegetables. ? Limit foods that are high in fat and processed sugars, such as fried or sweet foods. Medicines  Take over-the-counter and prescription medicines only as told by your health care provider.  Do not change medicines without talking with your health care provider.  Aspirin or medicines that contain aspirin may make the bleeding worse. Do not take those medicines: ? During the week before your menstrual period. ? During your menstrual period.  If you were prescribed iron pills, take them as told by your health care provider. Iron pills help to replace iron that your body loses because of this condition. Activity  If you need to change your sanitary pad or tampon more than one time every 2 hours: ? Lie in bed with your feet raised (elevated). ? Place a cold pack on your lower abdomen. ? Rest as much as possible until the bleeding stops or slows down.  Do not try to lose weight until the bleeding has stopped and your blood iron level is back to normal. General instructions   For two  months, write down: ? When your menstrual period starts. ? When your menstrual period ends. ? When any abnormal vaginal bleeding occurs. ? What problems you notice.  Keep all follow up visits as told by your health care provider. This is important. Contact a health care provider if you:  Feel light-headed or weak.  Have nausea and vomiting.  Cannot eat or drink without vomiting.  Feel dizzy or have diarrhea while you are taking medicines.  Are taking birth control pills or hormones, and you want to change them or stop taking them. Get help right away if:  You develop a fever or chills.  You need to change your sanitary pad or tampon more than one time per hour.  Your vaginal bleeding becomes heavier, or your flow contains clots more often.  You develop pain in your abdomen.  You lose consciousness.  You develop a rash. Summary  Dysfunctional uterine bleeding is abnormal bleeding from the uterus.  It includes menstrual bleeding of abnormal duration, volume, or regularity.  Bleeding after sex and after menopause are also considered dysfunctional uterine bleeding. This information is not intended to replace advice given to you by your health care provider. Make sure you discuss any questions you have with your health care provider. Document Released: 08/04/2000 Document Revised: 01/16/2018 Document Reviewed: 01/16/2018 Elsevier Patient Education  2020 [place] Maintenance, Female Adopting a healthy lifestyle and getting preventive care are important in promoting health and wellness. Ask your health care provider about:  The right schedule for you to have regular tests and exams.  Things you can  do on your own to prevent diseases and keep yourself healthy. What should I know about diet, weight, and exercise? Eat a healthy diet   Eat a diet that includes plenty of vegetables, fruits, low-fat dairy products, and lean protein.  Do not eat a lot of foods  that are high in solid fats, added sugars, or sodium. Maintain a healthy weight Body mass index (BMI) is used to identify weight problems. It estimates body fat based on height and weight. Your health care provider can help determine your BMI and help you achieve or maintain a healthy weight. Get regular exercise Get regular exercise. This is one of the most important things you can do for your health. Most adults should:  Exercise for at least 150 minutes each week. The exercise should increase your heart rate and make you sweat (moderate-intensity exercise).  Do strengthening exercises at least twice a week. This is in addition to the moderate-intensity exercise.  Spend less time sitting. Even light physical activity can be beneficial. Watch cholesterol and blood lipids Have your blood tested for lipids and cholesterol at 50 years of age, then have this test every 5 years. Have your cholesterol levels checked more often if:  Your lipid or cholesterol levels are high.  You are older than 50 years of age.  You are at high risk for heart disease. What should I know about cancer screening? Depending on your health history and family history, you may need to have cancer screening at various ages. This may include screening for:  Breast cancer.  Cervical cancer.  Colorectal cancer.  Skin cancer.  Lung cancer. What should I know about heart disease, diabetes, and high blood pressure? Blood pressure and heart disease  High blood pressure causes heart disease and increases the risk of stroke. This is more likely to develop in people who have high blood pressure readings, are of African descent, or are overweight.  Have your blood pressure checked: ? Every 3-5 years if you are 7118-50 years of age. ? Every year if you are 50 years old or older. Diabetes Have regular diabetes screenings. This checks your fasting blood sugar level. Have the screening done:  Once every three years after  age 50 if you are at a normal weight and have a low risk for diabetes.  More often and at a younger age if you are overweight or have a high risk for diabetes. What should I know about preventing infection? Hepatitis B If you have a higher risk for hepatitis B, you should be screened for this virus. Talk with your health care provider to find out if you are at risk for hepatitis B infection. Hepatitis C Testing is recommended for:  Everyone born from [birth year] through 1965.  Anyone with known risk factors for hepatitis C. Sexually transmitted infections (STIs)  Get screened for STIs, including gonorrhea and chlamydia, if: ? You are sexually active and are younger than 50 years of age. ? You are older than 50 years of age and your health care provider tells you that you are at risk for this type of infection. ? Your sexual activity has changed since you were last screened, and you are at increased risk for chlamydia or gonorrhea. Ask your health care provider if you are at risk.  Ask your health care provider about whether you are at high risk for HIV. Your health care provider may recommend a prescription medicine to help prevent HIV infection. If you choose to take medicine  to prevent HIV, you should first get tested for HIV. You should then be tested every 3 months for as long as you are taking the medicine. Pregnancy  If you are about to stop having your period (premenopausal) and you may become pregnant, seek counseling before you get pregnant.  Take 400 to 800 micrograms (mcg) of folic acid every day if you become pregnant.  Ask for birth control (contraception) if you want to prevent pregnancy. Osteoporosis and menopause Osteoporosis is a disease in which the bones lose minerals and strength with aging. This can result in bone fractures. If you are 67 years old or older, or if you are at risk for osteoporosis and fractures, ask your health care provider if you should:  Be screened for  bone loss.  Take a calcium or vitamin D supplement to lower your risk of fractures.  Be given hormone replacement therapy (HRT) to treat symptoms of menopause. Follow these instructions at home: Lifestyle  Do not use any products that contain nicotine or tobacco, such as cigarettes, e-cigarettes, and chewing tobacco. If you need help quitting, ask your health care provider.  Do not use street drugs.  Do not share needles.  Ask your health care provider for help if you need support or information about quitting drugs. Alcohol use  Do not drink alcohol if: ? Your health care provider tells you not to drink. ? You are pregnant, may be pregnant, or are planning to become pregnant.  If you drink alcohol: ? Limit how much you use to 0-1 drink a day. ? Limit intake if you are breastfeeding.  Be aware of how much alcohol is in your drink. In the [place] one drink equals one 12 oz bottle of beer (355 mL), one 5 oz glass of wine (148 mL), or one 1 oz glass of hard liquor (44 mL). General instructions  Schedule regular health, dental, and eye exams.  Stay current with your vaccines.  Tell your health care provider if: ? You often feel depressed. ? You have ever been abused or do not feel safe at home. Summary  Adopting a healthy lifestyle and getting preventive care are important in promoting health and wellness.  Follow your health care provider's instructions about healthy diet, exercising, and getting tested or screened for diseases.  Follow your health care provider's instructions on monitoring your cholesterol and blood pressure. This information is not intended to replace advice given to you by your health care provider. Make sure you discuss any questions you have with your health care provider. Document Released: 02/20/2011 Document Revised: 07/31/2018 Document Reviewed: 07/31/2018 Elsevier Patient Education  2020 [employer].

## 2019-07-30 NOTE — Progress Notes (Signed)
No trich, yeast, BV. Normal vaginal secretions.

## 2019-07-31 ENCOUNTER — Ambulatory Visit (INDEPENDENT_AMBULATORY_CARE_PROVIDER_SITE_OTHER): Payer: No Typology Code available for payment source

## 2019-07-31 ENCOUNTER — Other Ambulatory Visit: Payer: Self-pay

## 2019-07-31 DIAGNOSIS — N938 Other specified abnormal uterine and vaginal bleeding: Secondary | ICD-10-CM

## 2019-07-31 LAB — COMPLETE METABOLIC PANEL WITH GFR
AG Ratio: 1.9 (calc) (ref 1.0–2.5)
ALT: 11 U/L (ref 6–29)
AST: 13 U/L (ref 10–35)
Albumin: 4.2 g/dL (ref 3.6–5.1)
Alkaline phosphatase (APISO): 46 U/L (ref 37–153)
BUN: 12 mg/dL (ref 7–25)
CO2: 25 mmol/L (ref 20–32)
Calcium: 9 mg/dL (ref 8.6–10.4)
Chloride: 107 mmol/L (ref 98–110)
Creat: 0.98 mg/dL (ref 0.50–1.05)
GFR, Est African American: 78 mL/min/{1.73_m2} (ref >=60)
GFR, Est Non African American: 67 mL/min/{1.73_m2} (ref >=60)
Globulin: 2.2 g/dL (calc) (ref 1.9–3.7)
Glucose, Bld: 90 mg/dL (ref 65–99)
Potassium: 3.9 mmol/L (ref 3.5–5.3)
Sodium: 139 mmol/L (ref 135–146)
Total Bilirubin: 0.5 mg/dL (ref 0.2–1.2)
Total Protein: 6.4 g/dL (ref 6.1–8.1)

## 2019-07-31 LAB — CBC
HCT: 28.1 % — ABNORMAL LOW (ref 35.0–45.0)
Hemoglobin: 9 g/dL — ABNORMAL LOW (ref 11.7–15.5)
MCH: 29 pg (ref 27.0–33.0)
MCHC: 32 g/dL (ref 32.0–36.0)
MCV: 90.6 fL (ref 80.0–100.0)
MPV: 12.5 fL (ref 7.5–12.5)
Platelets: 238 10*3/uL (ref 140–400)
RBC: 3.1 10*6/uL — ABNORMAL LOW (ref 3.80–5.10)
RDW: 12.8 % (ref 11.0–15.0)
WBC: 6.7 10*3/uL (ref 3.8–10.8)

## 2019-07-31 LAB — CYTOLOGY - PAP
Comment: NEGATIVE
Diagnosis: NEGATIVE
High risk HPV: NEGATIVE

## 2019-07-31 LAB — FSH/LH
FSH: 12.4 m[IU]/mL
LH: 8.8 m[IU]/mL

## 2019-07-31 LAB — FERRITIN: Ferritin: 3 ng/mL — ABNORMAL LOW (ref 16–232)

## 2019-07-31 LAB — LIPID PANEL W/REFLEX DIRECT LDL
Cholesterol: 212 mg/dL — ABNORMAL HIGH (ref <200)
HDL: 50 mg/dL (ref >=50)
LDL Cholesterol (Calc): 144 mg/dL (calc) — ABNORMAL HIGH
Non-HDL Cholesterol (Calc): 162 mg/dL (calc) — ABNORMAL HIGH (ref <130)
Total CHOL/HDL Ratio: 4.2 (calc) (ref <5.0)
Triglycerides: 75 mg/dL (ref <150)

## 2019-07-31 LAB — TSH: TSH: 3.2 mIU/L

## 2019-08-01 ENCOUNTER — Encounter: Payer: Self-pay | Admitting: Physician Assistant

## 2019-08-01 DIAGNOSIS — D509 Iron deficiency anemia, unspecified: Secondary | ICD-10-CM | POA: Insufficient documentation

## 2019-08-01 MED ORDER — FERROUS SULFATE 325 (65 FE) MG PO TBEC: 325.0000 mg | DELAYED_RELEASE_TABLET | Freq: Two times a day (BID) | ORAL | 5 refills | Status: DC

## 2019-08-01 NOTE — Progress Notes (Signed)
Call pt:   Kidney, liver, glucose look great. Thyroid is in normal range but upper limits of normal. I would like to recheck in 1-2 months to see if trending up.  Your hemoglobin and iron stores are really low. Likely from recent bleeding. Need to start ferrous sulfate 325mg  twice a day with meals. Recheck in 1-2 months.  Your [place] does not show menopause but looks like started to head that way in the peri-menopausal direction.  Pap is negative for any abnormal cells and HPV. Repeat in 5 years.  Cholesterol is not to goal. HDL(good) is good though. TG are good. Work on diet lower in fats and processed food and recheck in a year. You don't have other risk factors for CV disease. Will continue to monitor.

## 2019-08-01 NOTE — Addendum Note (Signed)
Addended by: Donella Stade on: 08/01/2019 06:40 AM   Modules accepted: Orders

## 2019-08-01 NOTE — Progress Notes (Signed)
Call patient:   Normal endometrium thickness. No uterine masses. No ovarian masses and not able to be visualized. Cyst noted at cervix(not worrisome and not causing bleeding).   If bleeding continues please follow up.   Luvenia Starch

## 2019-08-14 ENCOUNTER — Ambulatory Visit (INDEPENDENT_AMBULATORY_CARE_PROVIDER_SITE_OTHER): Payer: No Typology Code available for payment source

## 2019-08-14 ENCOUNTER — Other Ambulatory Visit: Payer: Self-pay

## 2019-08-14 DIAGNOSIS — Z1231 Encounter for screening mammogram for malignant neoplasm of breast: Secondary | ICD-10-CM | POA: Diagnosis not present

## 2019-08-14 DIAGNOSIS — Z0000 Encounter for general adult medical examination without abnormal findings: Secondary | ICD-10-CM

## 2019-08-18 NOTE — Progress Notes (Signed)
Call pt: normal mammogram. Follow up in 1 year.

## 2020-06-13 DIAGNOSIS — R569 Unspecified convulsions: Secondary | ICD-10-CM

## 2020-06-13 HISTORY — DX: Unspecified convulsions: R56.9

## 2020-07-12 ENCOUNTER — Encounter: Payer: Self-pay | Admitting: Physician Assistant

## 2020-07-12 ENCOUNTER — Ambulatory Visit (INDEPENDENT_AMBULATORY_CARE_PROVIDER_SITE_OTHER): Payer: No Typology Code available for payment source | Admitting: Physician Assistant

## 2020-07-12 VITALS — BP 132/80 | HR 92 | Ht 66.5 in | Wt 155.0 lb

## 2020-07-12 DIAGNOSIS — D508 Other iron deficiency anemias: Secondary | ICD-10-CM | POA: Diagnosis not present

## 2020-07-12 DIAGNOSIS — E782 Mixed hyperlipidemia: Secondary | ICD-10-CM | POA: Diagnosis not present

## 2020-07-12 DIAGNOSIS — Z566 Other physical and mental strain related to work: Secondary | ICD-10-CM | POA: Insufficient documentation

## 2020-07-12 DIAGNOSIS — R569 Unspecified convulsions: Secondary | ICD-10-CM | POA: Diagnosis not present

## 2020-07-12 DIAGNOSIS — Z131 Encounter for screening for diabetes mellitus: Secondary | ICD-10-CM

## 2020-07-12 DIAGNOSIS — F17200 Nicotine dependence, unspecified, uncomplicated: Secondary | ICD-10-CM

## 2020-07-12 NOTE — Progress Notes (Addendum)
Acute Office Visit  Subjective:    Patient ID: Mary Fowler, female    DOB: 16-Apr-1971, 49 y.o.   MRN: [id number]  Chief Complaint  Patient presents with  . Hospitalization Follow-up  . Seizures    Seizures  Pertinent negatives include no headaches and no speech difficulty.   Patient is in today for HFU after first incidence of a seizure like activity that occurred 06/13/2020 while she was laying in bed. Pt reports eye rolled back in head and shaking and moaning. She did urinate a little on herself. When she woke up she had some confusion and aphasia.  Big question patient and her husband have is whether pt can return to driving. Pt doesn't remember event but husband described seizure very well and work-up in hospital was very thorough (see 06/13/2020 note).   Seizure work up was completed as follows:  - CT head showed no acute intracranial abnormality. - MRI of the brain showed Normal MRI of the brain without and with contrast. - EEG Awake and Drowsy: There were no focal asymmetries or epileptiform abnormalities.This is a normal adult awake and drowsy EEG. - X-ray of the chest one-view showed no acute cardiopulmonary abnormalities.  - Electrocardiogram was checked showing normal sinus rhythm.  - Magnesium and Ethanol were checked and were within normal limits.  - She had a urinalysis which did not reveal any evidence of UTI.  - She had urine drug of abuse screen and was positive for cannabinoids.  - She was started on DVT prophylaxis with SCDs and Lovenox 40mg  daily subcutaneously.   We believe she is medically safe to be discharged at this point in time. She is back to her baseline and had no witnessed seizures while admitted. Since EEG was normal we will defer antiepileptic medications at this time.She will need close follow up with primary care provider and outpatient neurologist after discharge. She is agreeable to being discharged at this point in time. We discussed the  diagnosis, prognosis and treatment performed during her hospital stay. The risks and benefits of medications were also discussed at length and the patient says she agrees with not starting AEDs. She verbalized complete understanding, is agreeable to plan of care and would like to be discharged today.    Seizure Precautions: - please refrain from climbing higher than 10 feet - please refrain from taking a tub bath alone and/or leave the door unlocked while in the shower - please avoid sleep deprivation and excessive alcohol use - please refrain from swimming unsupervised - please wear a helmet when riding a bike or rollerblades - please refrain from driving for 6 months since last seizure - older persons are advised to take reasonable precautions  - you have restrictions with more dangerous activities, such as operating heavy machinery or machines with rapidly moving parts and playing contact sports   Pt did engaged in manual labor day before event and was coughing through the night. Pt has been drinking more water since this happened and says headaches resolved after 2 days post seizure.   Pt takes ibuprofen and daily women's vitamin. Has stopped taking the over 50 vitamin just in case it was associated.   History reviewed. No pertinent past medical history.  History reviewed. No pertinent surgical history.  Family History  Problem Relation Age of Onset  . Hypothyroidism Mother   . Hyperlipidemia Mother   . Anxiety disorder Sister   . Diabetes Maternal Grandmother     Social History  Socioeconomic History  . Marital status: Married    Spouse name: Not on file  . Number of children: 1  . Years of education: Not on file  . Highest education level: Not on file  Occupational History  . Occupation: Careers information officer.     Employer: [employer]    Comment: Works on [employer] for [employer].   Tobacco Use  . Smoking status: Former Smoker    Quit date: 08/21/2001    Years  since quitting: 18.9  . Smokeless tobacco: Never Used  Vaping Use  . Vaping Use: Never used  Substance and Sexual Activity  . Alcohol use: Yes    Comment: rare  . Drug use: No  . 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  Other Topics Concern  . Not on file  Social History Narrative   Walks for exercise. Swimming.     Social Determinants of Health   Financial Resource Strain:   . Difficulty of Paying Living Expenses: Not on file  Food Insecurity:   . Worried About Programme researcher, broadcasting/film/video in the Last Year: Not on file  . Ran Out of Food in the Last Year: Not on file  Transportation Needs:   . Lack of Transportation (Medical): Not on file  . Lack of Transportation (Non-Medical): Not on file  Physical Activity:   . Days of Exercise per Week: Not on file  . Minutes of Exercise per Session: Not on file  Stress:   . Feeling of Stress : Not on file  Social Connections:   . Frequency of Communication with Friends and Family: Not on file  . Frequency of Social Gatherings with Friends and Family: Not on file  . Attends Religious Services: Not on file  . Active Member of Clubs or Organizations: Not on file  . Attends Banker Meetings: Not on file  . Marital Status: Not on file  Intimate Partner Violence:   . Fear of Current or Ex-Partner: Not on file  . Emotionally Abused: Not on file  . Physically Abused: Not on file  . Sexually Abused: Not on file    Outpatient Medications Prior to Visit  Medication Sig Dispense Refill  . ibuprofen (ADVIL,MOTRIN) 800 MG tablet Take 800 mg by mouth every 8 (eight) hours as needed.    . ferrous sulfate 325 (65 FE) MG EC tablet Take 1 tablet (325 mg total) by mouth 2 (two) times daily with a meal. 60 tablet 5   No facility-administered medications prior to visit.    Allergies  Allergen Reactions  . Codeine Nausea Only    Review  of Systems  Neurological: Positive for seizures. Negative for tremors, speech difficulty, weakness, light-headedness and headaches.       Objective:    Physical Exam Vitals reviewed.  Constitutional:      Appearance: Normal appearance.  HENT:     Head: Normocephalic.     Mouth/Throat:     Mouth: Mucous membranes are moist.  Eyes:     Extraocular Movements: Extraocular movements intact.     Conjunctiva/sclera: Conjunctivae normal.     Pupils: Pupils are equal, round, and reactive to light.  Cardiovascular:     Rate and Rhythm: Normal rate and regular rhythm.     Pulses: Normal pulses.     Heart sounds: Normal heart sounds.  Pulmonary:     Effort: Pulmonary effort  is normal.     Breath sounds: Normal breath sounds.  Musculoskeletal:     Right lower leg: No edema.     Left lower leg: No edema.     Comments: Upper and lower extremity strength 5/5.   Neurological:     General: No focal deficit present.     Mental Status: She is alert and oriented to person, place, and time.  Psychiatric:        Mood and Affect: Mood normal.        Assessment & Plan:  Marland KitchenMarland KitchenVianka was seen today for hospitalization follow-up and seizures.  Diagnoses and all orders for this visit:  Seizure-like activity (HCC) -     Ambulatory referral to Neurology -     COMPLETE METABOLIC PANEL WITH GFR  Iron deficiency anemia secondary to inadequate dietary iron intake -     CBC with Differential/Platelet -     Fe+TIBC+Fer  Mixed hyperlipidemia -     Lipid Panel w/reflex Direct LDL  Screening for diabetes mellitus -     Fe+TIBC+Fer -     COMPLETE METABOLIC PANEL WITH GFR  Current smoker   Discussed pt may not drive until released by neurology. New referral made since had not been called by other referral. I do not see that vitamin would cause seizure but can take question to neurology. Pt had not smoked weed for 2 weeks and was at party.   She was a little anemic. Start iron.   Needs screening  labs in 6 weeks have labs drawn.   Discussed smoking cessation. Pt declined.  Discussed ways to manage stress. Pt declines any medication.   Marland KitchenHarlon Flor PA-C, have reviewed and agree with the above documentation in it's entirety.

## 2020-09-15 ENCOUNTER — Ambulatory Visit: Payer: No Typology Code available for payment source | Admitting: Physician Assistant

## 2020-09-15 ENCOUNTER — Other Ambulatory Visit: Payer: Self-pay

## 2020-09-15 ENCOUNTER — Encounter: Payer: Self-pay | Admitting: Physician Assistant

## 2020-09-15 VITALS — BP 118/82 | HR 78 | Ht 66.5 in | Wt 157.0 lb

## 2020-09-15 DIAGNOSIS — R569 Unspecified convulsions: Secondary | ICD-10-CM

## 2020-09-15 NOTE — Patient Instructions (Signed)
Health Maintenance, Female Adopting a healthy lifestyle and getting preventive care are important in promoting health and wellness. Ask your health care provider about:  The right schedule for you to have regular tests and exams.  Things you can do on your own to prevent diseases and keep yourself healthy. What should I know about diet, weight, and exercise? Eat a healthy diet  Eat a diet that includes plenty of vegetables, fruits, low-fat dairy products, and lean protein.  Do not eat a lot of foods that are high in solid fats, added sugars, or sodium.   Maintain a healthy weight Body mass index (BMI) is used to identify weight problems. It estimates body fat based on height and weight. Your health care provider can help determine your BMI and help you achieve or maintain a healthy weight. Get regular exercise Get regular exercise. This is one of the most important things you can do for your health. Most adults should:  Exercise for at least 150 minutes each week. The exercise should increase your heart rate and make you sweat (moderate-intensity exercise).  Do strengthening exercises at least twice a week. This is in addition to the moderate-intensity exercise.  Spend less time sitting. Even light physical activity can be beneficial. Watch cholesterol and blood lipids Have your blood tested for lipids and cholesterol at 52 years of age, then have this test every 5 years. Have your cholesterol levels checked more often if:  Your lipid or cholesterol levels are high.  You are older than 52 years of age.  You are at high risk for heart disease. What should I know about cancer screening? Depending on your health history and family history, you may need to have cancer screening at various ages. This may include screening for:  Breast cancer.  Cervical cancer.  Colorectal cancer.  Skin cancer.  Lung cancer. What should I know about heart disease, diabetes, and high blood  pressure? Blood pressure and heart disease  High blood pressure causes heart disease and increases the risk of stroke. This is more likely to develop in people who have high blood pressure readings, are of African descent, or are overweight.  Have your blood pressure checked: ? Every 3-5 years if you are 18-39 years of age. ? Every year if you are 40 years old or older. Diabetes Have regular diabetes screenings. This checks your fasting blood sugar level. Have the screening done:  Once every three years after age 40 if you are at a normal weight and have a low risk for diabetes.  More often and at a younger age if you are overweight or have a high risk for diabetes. What should I know about preventing infection? Hepatitis B If you have a higher risk for hepatitis B, you should be screened for this virus. Talk with your health care provider to find out if you are at risk for hepatitis B infection. Hepatitis C Testing is recommended for:  Everyone born from 1945 through 1965.  Anyone with known risk factors for hepatitis C. Sexually transmitted infections (STIs)  Get screened for STIs, including gonorrhea and chlamydia, if: ? You are sexually active and are younger than 52 years of age. ? You are older than 52 years of age and your health care provider tells you that you are at risk for this type of infection. ? Your sexual activity has changed since you were last screened, and you are at increased risk for chlamydia or gonorrhea. Ask your health care provider   if you are at risk.  Ask your health care provider about whether you are at high risk for HIV. Your health care provider may recommend a prescription medicine to help prevent HIV infection. If you choose to take medicine to prevent HIV, you should first get tested for HIV. You should then be tested every 3 months for as long as you are taking the medicine. Pregnancy  If you are about to stop having your period (premenopausal) and  you may become pregnant, seek counseling before you get pregnant.  Take 400 to 800 micrograms (mcg) of folic acid every day if you become pregnant.  Ask for birth control (contraception) if you want to prevent pregnancy. Osteoporosis and menopause Osteoporosis is a disease in which the bones lose minerals and strength with aging. This can result in bone fractures. If you are 65 years old or older, or if you are at risk for osteoporosis and fractures, ask your health care provider if you should:  Be screened for bone loss.  Take a calcium or vitamin D supplement to lower your risk of fractures.  Be given hormone replacement therapy (HRT) to treat symptoms of menopause. Follow these instructions at home: Lifestyle  Do not use any products that contain nicotine or tobacco, such as cigarettes, e-cigarettes, and chewing tobacco. If you need help quitting, ask your health care provider.  Do not use street drugs.  Do not share needles.  Ask your health care provider for help if you need support or information about quitting drugs. Alcohol use  Do not drink alcohol if: ? Your health care provider tells you not to drink. ? You are pregnant, may be pregnant, or are planning to become pregnant.  If you drink alcohol: ? Limit how much you use to 0-1 drink a day. ? Limit intake if you are breastfeeding.  Be aware of how much alcohol is in your drink. In the [place] one drink equals one 12 oz bottle of beer (355 mL), one 5 oz glass of wine (148 mL), or one 1 oz glass of hard liquor (44 mL). General instructions  Schedule regular health, dental, and eye exams.  Stay current with your vaccines.  Tell your health care provider if: ? You often feel depressed. ? You have ever been abused or do not feel safe at home. Summary  Adopting a healthy lifestyle and getting preventive care are important in promoting health and wellness.  Follow your health care provider's instructions about healthy  diet, exercising, and getting tested or screened for diseases.  Follow your health care provider's instructions on monitoring your cholesterol and blood pressure. This information is not intended to replace advice given to you by your health care provider. Make sure you discuss any questions you have with your health care provider. Document Revised: 07/31/2018 Document Reviewed: 07/31/2018 Elsevier Patient Education  2021 Elsevier Inc.  

## 2020-09-15 NOTE — Progress Notes (Signed)
Subjective:    Patient ID: Mary Fowler, female    DOB: [birth year], 50 y.o.   MRN: [id number]  HPI  Patient is a 50 year old female with history of seizure-like activity on 06/13/2020 that sent her to the emergency room.  She was seen 1 month from then here in the office.  She presents today for another 52-month follow-up.  She has had no other seizure-like activity.  She still has no clue what the trigger for this activity was.  EEG in the hospital never showed any seizure activity.  She does have a neurology appointment scheduled for February.  It is only been 3 months and she is still not driving.  Otherwise she feels good with no complaints.  .. Active Ambulatory Problems    Diagnosis Date Noted  . 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 ACNE VULGARIS 11/04/2008  . Mixed hyperlipidemia 04/05/2012  . DUB (dysfunctional uterine bleeding) 07/30/2019  . IDA (iron deficiency anemia) 08/01/2019  . Seizure-like activity (HCC) 07/12/2020  . Current smoker 07/12/2020  . Stress at work 07/12/2020   Resolved Ambulatory Problems    Diagnosis Date Noted  . URI 07/17/2009  . CONTACT DERMATITIS&OTHER ECZEMA DUE TO PLANTS 11/04/2007   No Additional Past Medical History      Review of Systems  All other systems reviewed and are negative.      Objective:   Physical Exam Vitals reviewed.  Constitutional:      Appearance: Normal appearance.  HENT:     Head: Normocephalic.  Eyes:     Extraocular Movements: Extraocular movements intact.     Pupils: Pupils are equal, round, and reactive to light.  Neck:     Vascular: No carotid bruit.  Cardiovascular:     Rate and Rhythm: Normal rate and regular rhythm.     Pulses: Normal pulses.  Pulmonary:     Effort: Pulmonary effort is normal.     Breath sounds: Normal breath sounds.  Musculoskeletal:     Cervical back: Normal range of motion and neck supple. No tenderness.  Neurological:     General: No focal deficit present.      Mental Status: She is alert and oriented to person, place, and time.  Psychiatric:        Mood and Affect: Mood normal.     .. Depression screen [facility] 2/9 07/30/2019  Decreased Interest 1  Down, Depressed, Hopeless 0  PHQ - 2 Score 1  Altered sleeping 1  Tired, decreased energy 1  Change in appetite 0  Feeling bad or failure about yourself  0  Trouble concentrating 0  Moving slowly or fidgety/restless 0  Suicidal thoughts 0  PHQ-9 Score 3  Difficult doing work/chores Not difficult at all   .[name] GAD 7 : Generalized Anxiety Score 07/30/2019  Nervous, Anxious, on Edge 1  Control/stop worrying 1  Worry too much - different things 0  Trouble relaxing 0  Restless 0  Easily annoyed or irritable 1  Afraid - awful might happen 1  Total GAD 7 Score 4  Anxiety Difficulty Not difficult at all          Assessment & Plan:  Marland KitchenMarland KitchenHalyn was seen today for seizures.  Diagnoses and all orders for this visit:  Seizure-like activity (HCC)   P Will be released back to drive atient is doing very well.  She has had no other seizure-like activity.  I did encourage her to keep her February appointment with neurology.  After 6 months she  will be released back to drive unless neurology releases her sooner.  No exact known trigger of event.  The only thing she did report was physically active and working the day before this occurred.  She did not get her labs from last visit.  Reprinted and sent her downstairs to capture this.  Will call with results.  Vitals are very reassuring.  Physical exam no abnormalities.

## 2020-09-16 LAB — COMPLETE METABOLIC PANEL WITH GFR
AG Ratio: 1.8 (calc) (ref 1.0–2.5)
ALT: 12 U/L (ref 6–29)
AST: 15 U/L (ref 10–35)
Albumin: 4.4 g/dL (ref 3.6–5.1)
Alkaline phosphatase (APISO): 59 U/L (ref 37–153)
BUN: 14 mg/dL (ref 7–25)
CO2: 29 mmol/L (ref 20–32)
Calcium: 9.3 mg/dL (ref 8.6–10.4)
Chloride: 104 mmol/L (ref 98–110)
Creat: 0.94 mg/dL (ref 0.50–1.05)
GFR, Est African American: 81 mL/min/{1.73_m2} (ref >=60)
GFR, Est Non African American: 70 mL/min/{1.73_m2} (ref >=60)
Globulin: 2.4 g/dL (calc) (ref 1.9–3.7)
Glucose, Bld: 87 mg/dL (ref 65–99)
Potassium: 4.4 mmol/L (ref 3.5–5.3)
Sodium: 139 mmol/L (ref 135–146)
Total Bilirubin: 0.5 mg/dL (ref 0.2–1.2)
Total Protein: 6.8 g/dL (ref 6.1–8.1)

## 2020-09-16 LAB — LIPID PANEL W/REFLEX DIRECT LDL
Cholesterol: 240 mg/dL — ABNORMAL HIGH (ref <200)
HDL: 48 mg/dL — ABNORMAL LOW (ref >=50)
LDL Cholesterol (Calc): 173 mg/dL (calc) — ABNORMAL HIGH
Non-HDL Cholesterol (Calc): 192 mg/dL (calc) — ABNORMAL HIGH (ref <130)
Total CHOL/HDL Ratio: 5 (calc) — ABNORMAL HIGH (ref <5.0)
Triglycerides: 81 mg/dL (ref <150)

## 2020-09-16 LAB — CBC WITH DIFFERENTIAL/PLATELET
Absolute Monocytes: 666 cells/uL (ref 200–950)
Basophils Absolute: 104 cells/uL (ref 0–200)
Basophils Relative: 1.4 %
Eosinophils Absolute: 289 cells/uL (ref 15–500)
Eosinophils Relative: 3.9 %
HCT: 43.9 % (ref 35.0–45.0)
Hemoglobin: 14.3 g/dL (ref 11.7–15.5)
Lymphs Abs: 1443 cells/uL (ref 850–3900)
MCH: 28.6 pg (ref 27.0–33.0)
MCHC: 32.6 g/dL (ref 32.0–36.0)
MCV: 87.8 fL (ref 80.0–100.0)
MPV: 13.7 fL — ABNORMAL HIGH (ref 7.5–12.5)
Monocytes Relative: 9 %
Neutro Abs: 4899 cells/uL (ref 1500–7800)
Neutrophils Relative %: 66.2 %
Platelets: 182 10*3/uL (ref 140–400)
RBC: 5 10*6/uL (ref 3.80–5.10)
RDW: 13 % (ref 11.0–15.0)
Total Lymphocyte: 19.5 %
WBC: 7.4 10*3/uL (ref 3.8–10.8)

## 2020-09-16 LAB — IRON,TIBC AND FERRITIN PANEL
%SAT: 31 % (calc) (ref 16–45)
Ferritin: 12 ng/mL — ABNORMAL LOW (ref 16–232)
Iron: 106 ug/dL (ref 45–160)
TIBC: 343 mcg/dL (calc) (ref 250–450)

## 2020-09-16 NOTE — Progress Notes (Signed)
Mary Fowler,   Kidney, liver, glucose look great.  Iron stores are low but hemoglobin is good. I would increase iron rich foods or start a MVI with iron in it.  Cholesterol is quite up. LdL of 173 with your smoking risk I suggest you start a statin to lower your CV event risk. Is it ok to send to pharmacy?

## 2020-09-27 ENCOUNTER — Encounter: Payer: Self-pay | Admitting: Diagnostic Neuroimaging

## 2020-09-27 ENCOUNTER — Other Ambulatory Visit: Payer: Self-pay

## 2020-09-27 ENCOUNTER — Ambulatory Visit (INDEPENDENT_AMBULATORY_CARE_PROVIDER_SITE_OTHER): Payer: No Typology Code available for payment source | Admitting: Diagnostic Neuroimaging

## 2020-09-27 VITALS — BP 131/83 | HR 79 | Ht 66.5 in | Wt 160.2 lb

## 2020-09-27 DIAGNOSIS — R569 Unspecified convulsions: Secondary | ICD-10-CM | POA: Diagnosis not present

## 2020-09-27 NOTE — Patient Instructions (Signed)
NEW ONSET SEIZURE (06/13/20)  - monitor for now; no anti-seizure meds recommended at this time  - According to [place] law, you can not drive unless you are seizure / syncope free for at least 6 months and under physician's care (expected return to driving 0/17/79 if no more seizures)  - Please maintain precautions. Do not participate in activities where a loss of awareness could harm you or someone else. No swimming alone, no tub bathing, no hot tubs, no driving, no operating motorized vehicles (cars, ATVs, motocycles, etc), lawnmowers, power tools or firearms. No standing at heights, such as rooftops, ladders or stairs. Avoid hot objects such as stoves, heaters, open fires. Wear a helmet when riding a bicycle, scooter, skateboard, etc. and avoid areas of traffic. Set your water heater to 120 degrees or less.

## 2020-09-27 NOTE — Progress Notes (Signed)
[facility]  PATIENT: Mary Fowler DOB: [birth year]  REFERRING CLINICIAN: Jomarie Longs, PA-C HISTORY FROM: patient  REASON FOR VISIT: new consult    HISTORICAL  CHIEF COMPLAINT:  Chief Complaint  Patient presents with  . Seizures    Rm 7 New Patient, husband- Jonny Ruiz "They think I had a seizure on 06/13/20;  Had CT, MRI, EEG"    HISTORY OF PRESENT ILLNESS:   50 year old female here for evaluation of new onset seizure.  06/13/2020 patient was asleep when all of a sudden she made strange noises, and her husband came from the other room.  Her eyes were rolled back and she was breathing heavily.  Her body was stiffened up.  No tongue biting.  There may have been urinary incontinence.  Patient was confused afterwards, but able to stand up and walk around with assistance from husband.  He had called 911 and they came to her home for evaluation.  She was taken to the hospital.  She does not remember events clearly afterwards.  CT, MRI and EEG were obtained which were unremarkable.  Patient was diagnosed with new onset seizure.  Antiseizure medications were not started as this was the only episode she has had of seizure activity.  She did have 2 other episodes were felt to be more likely syncopal events, with one episode at age 49 years old and one episode at age 2 years old.  No significant triggering aggravating factors prior to the event in October 2021 other than patient and husband were doing fairly strenuous yard work earlier that day.   REVIEW OF SYSTEMS: Full 14 system review of systems performed and negative with exception of: As per HPI.  ALLERGIES: Allergies  Allergen Reactions  . Codeine Nausea Only    HOME MEDICATIONS: Outpatient Medications Prior to Visit  Medication Sig Dispense Refill  . ibuprofen (ADVIL,MOTRIN) 800 MG tablet Take 800 mg by mouth every 8 (eight) hours as needed.     No facility-administered medications prior to visit.    PAST  MEDICAL HISTORY: Past Medical History:  Diagnosis Date  . Seizures (HCC) 06/13/2020    PAST SURGICAL HISTORY: No past surgical history on file.  FAMILY HISTORY: Family History  Problem Relation Age of Onset  . Hypothyroidism Mother   . Hyperlipidemia Mother   . Anxiety disorder Sister   . Diabetes Maternal Grandmother     SOCIAL HISTORY: Social History   Socioeconomic History  . Marital status: Married    Spouse name: Jonny Ruiz  . Number of children: 1  . Years of education: Not on file  . Highest education level: Bachelor's degree (e.g., BA, AB, BS)  Occupational History  . Occupation: Careers information officer.     Employer: [employer]    Comment: Works on [employer] for [employer].   Tobacco Use  . Smoking status: Former Smoker    Quit date: 08/21/2002    Years since quitting: 18.1  . Smokeless tobacco: Never Used  Vaping Use  . Vaping Use: Never used  Substance and Sexual Activity  . Alcohol use: Yes    Comment: rare  . Drug use: No  . 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  Other Topics Concern  . Not on file  Social History Narrative   Walks for exercise. Swimming.     Lives with husband   Caffeine 2 a day  Social Determinants of Health   Financial Resource Strain: Not on file  Food Insecurity: Not on file  Transportation Needs: Not on file  Physical Activity: Not on file  Stress: Not on file  Social Connections: Not on file  Intimate Partner Violence: Not on file     PHYSICAL EXAM  GENERAL EXAM/CONSTITUTIONAL: Vitals:  Vitals:   09/27/20 0840  BP: 131/83  Pulse: 79  Weight: 160 lb 3.2 oz (72.7 kg)  Height: 5' 6.5" (1.689 m)   Body mass index is 25.47 kg/m. Wt Readings from Last 3 Encounters:  09/27/20 160 lb 3.2 oz (72.7 kg)  09/15/20 157 lb (71.2 kg)  07/12/20 155 lb (70.3 kg)    Patient is in no distress; well developed,  nourished and groomed; neck is supple  CARDIOVASCULAR:  Examination of carotid arteries is normal; no carotid bruits  Regular rate and rhythm, no murmurs  Examination of peripheral vascular system by observation and palpation is normal  EYES:  Ophthalmoscopic exam of optic discs and posterior segments is normal; no papilledema or hemorrhages No exam data present  MUSCULOSKELETAL:  Gait, strength, tone, movements noted in Neurologic exam below  NEUROLOGIC: MENTAL STATUS:  No flowsheet data found.  awake, alert, oriented to person, place and time  recent and remote memory intact  normal attention and concentration  language fluent, comprehension intact, naming intact  fund of knowledge appropriate  CRANIAL NERVE:   2nd - no papilledema on fundoscopic exam  2nd, 3rd, 4th, 6th - pupils equal and reactive to light, visual fields full to confrontation, extraocular muscles intact, no nystagmus  5th - facial sensation symmetric  7th - facial strength symmetric  8th - hearing intact  9th - palate elevates symmetrically, uvula midline  11th - shoulder shrug symmetric  12th - tongue protrusion midline  MOTOR:   normal bulk and tone, full strength in the BUE, BLE  SENSORY:   normal and symmetric to light touch, temperature, vibration  COORDINATION:   finger-nose-finger, fine finger movements normal  REFLEXES:   deep tendon reflexes present and symmetric  GAIT/STATION:   narrow based gait     DIAGNOSTIC DATA (LABS, IMAGING, TESTING) - I reviewed patient records, labs, notes, testing and imaging myself where available.  Lab Results  Component Value Date   WBC 7.4 09/15/2020   HGB 14.3 09/15/2020   HCT 43.9 09/15/2020   MCV 87.8 09/15/2020   PLT 182 09/15/2020      Component Value Date/Time   NA 139 09/15/2020 0000   K 4.4 09/15/2020 0000   CL 104 09/15/2020 0000   CO2 29 09/15/2020 0000   GLUCOSE 87 09/15/2020 0000   BUN 14 09/15/2020 0000    CREATININE 0.94 09/15/2020 0000   CALCIUM 9.3 09/15/2020 0000   PROT 6.8 09/15/2020 0000   ALBUMIN 4.5 03/01/2011 1444   AST 15 09/15/2020 0000   ALT 12 09/15/2020 0000   ALKPHOS 34 (L) 03/01/2011 1444   BILITOT 0.5 09/15/2020 0000   GFRNONAA 70 09/15/2020 0000   GFRAA 81 09/15/2020 0000   Lab Results  Component Value Date   CHOL 240 (H) 09/15/2020   HDL 48 (L) 09/15/2020   LDLCALC 173 (H) 09/15/2020   LDLDIRECT 157 (H) 03/01/2011   TRIG 81 09/15/2020   CHOLHDL 5.0 (H) 09/15/2020   No results found for: HGBA1C No results found for: VITAMINB12 Lab Results  Component Value Date   TSH 3.20 07/30/2019    06/13/20 EEG This EEG did not demonstrate a  propensity for seizures.  The absence of epileptiform abnormalities does not preclude a diagnosis of  seizures.   06/13/20 MRI brain - IMPRESSION: Normal MRI of the brain without and with contrast.      ASSESSMENT AND PLAN  50 y.o. year old female here with:  Dx:  1. New onset seizure (HCC)       PLAN:  NEW ONSET SEIZURE (06/13/20; tonic seizure, during sleep; unprovoked)  - monitor for now; no anti-seizure meds recommended at this time  - According to [place] law, you can not drive unless you are seizure / syncope free for at least 6 months and under physician's care (expected return to driving 1/44/31 if no more seizures)  - Please maintain precautions. Do not participate in activities where a loss of awareness could harm you or someone else. No swimming alone, no tub bathing, no hot tubs, no driving, no operating motorized vehicles (cars, ATVs, motocycles, etc), lawnmowers, power tools or firearms. No standing at heights, such as rooftops, ladders or stairs. Avoid hot objects such as stoves, heaters, open fires. Wear a helmet when riding a bicycle, scooter, skateboard, etc. and avoid areas of traffic. Set your water heater to 120 degrees or less.   Return for pending if symptoms worsen or fail to  improve.    Suanne Marker, MD 09/27/2020, 9:27 AM Certified in Neurology, Neurophysiology and Neuroimaging  [facility] [address], [address] [place], [place] [address] [phone]

## 2020-10-11 ENCOUNTER — Telehealth: Payer: Self-pay | Admitting: Diagnostic Neuroimaging

## 2020-10-11 DIAGNOSIS — G4733 Obstructive sleep apnea (adult) (pediatric): Secondary | ICD-10-CM

## 2020-10-11 MED ORDER — LEVETIRACETAM 500 MG PO TABS: 500.0000 mg | ORAL_TABLET | Freq: Two times a day (BID) | ORAL | 12 refills | Status: DC

## 2020-10-11 NOTE — Telephone Encounter (Signed)
Called patient and informed her of new Rx and sleep consult. Advised Keppra is typically well tolerated, may cause some drowsiness but usually resolves. Advised she'll get a call to schedule sleep consult. Patient verbalized understanding, appreciation.

## 2020-10-11 NOTE — Addendum Note (Signed)
Addended by: Joycelyn Schmid R on: 10/11/2020 05:10 PM   Modules accepted: Orders

## 2020-10-11 NOTE — Telephone Encounter (Signed)
Start levetiracetam 500mg  twice a day.  Also can check sleep consult, but would go ahead with anti-seizure meds.    Orders Placed This Encounter  Procedures  . Ambulatory referral to Sleep Studies   Meds ordered this encounter  Medications  . levETIRAcetam (KEPPRA) 500 MG tablet    Sig: Take 1 tablet (500 mg total) by mouth 2 (two) times daily.    Dispense:  60 tablet    Refill:  12    , MD 10/11/2020, 5:09 PM Certified in Neurology, Neurophysiology and Neuroimaging  [facility] [address] [place], [place] [phone]

## 2020-10-11 NOTE — Telephone Encounter (Addendum)
Called patient who stated her husband witnessed a seizure in her sleep, same symptoms as previous time. She had urinary incontinence, bit her lip, woke later, and was very tired. I advised will let Dr Danae Orleans know and call her with his reply, plan. Patient verbalized understanding, appreciation.

## 2020-10-11 NOTE — Telephone Encounter (Signed)
Pt had another seizure yesterday and would like a call from RN to discuss

## 2020-10-11 NOTE — Telephone Encounter (Signed)
Pt's husband called wanting to also add to this note that the pt was sound asleep when she had the seizures and he is wanting to know if Sleep Apnea is something that can be looked into before the pt is put on medications. Please advise.

## 2020-10-12 NOTE — Telephone Encounter (Signed)
Pt is asking for a call from Wynelle Cleveland to discuss how she feels the levETIRAcetam (KEPPRA) 500 MG tablet is too strong for her.  Please call.

## 2020-10-12 NOTE — Telephone Encounter (Signed)
Hold off on LEV; may try LEV 250mg  daily; then twice a day; can half 500mg  tab to try, then we can send in lower dose tab. Also could consider vimpat. -VRP

## 2020-10-12 NOTE — Telephone Encounter (Signed)
Called patient who stated she started keppra last night. When she got up she couldn't function at all,  physically, cognitively, or mentally. She asked if she can take a lower dose or a different medication. I advised will let Dr Marjory Lies know and call her back. Patient verbalized understanding, appreciation.

## 2020-10-12 NOTE — Addendum Note (Signed)
Addended by: Suanne Marker on: 10/12/2020 05:35 PM   Modules accepted: Orders

## 2020-10-13 NOTE — Telephone Encounter (Signed)
Called patient and reviewed Dr Mary Fowler plan with her. She has not taken any more Keppra since her 1st dose. She will try 250 mg daily and in a week or 2 increase to twice daily. She'll let us know how she is doing.  If she tolerates keppra 250 mg twice daily a new Rx will be sent in. We scheduled a 3 month FU with MD. She stated she hasn't heard about sleep consult. I advised will message [name] in sleep lab to call her. Patient verbalized understanding, appreciation. Message sent to [facility] requesting she call patient.

## 2020-10-29 ENCOUNTER — Telehealth: Payer: Self-pay | Admitting: Diagnostic Neuroimaging

## 2020-10-29 ENCOUNTER — Telehealth: Payer: Self-pay

## 2020-10-29 NOTE — Telephone Encounter (Signed)
Pt called to report the heart attack she had this week and wants to know if this will impact her sleep consult

## 2020-10-29 NOTE — Telephone Encounter (Signed)
Transition Care Management Unsuccessful Follow-up Telephone Call  Date of discharge and from where:  10/28/2020 from [facility]  Attempts:  1st Attempt  Reason for unsuccessful TCM follow-up call:  Unable to leave message

## 2020-11-01 ENCOUNTER — Encounter: Payer: Self-pay | Admitting: Diagnostic Neuroimaging

## 2020-11-01 NOTE — Telephone Encounter (Signed)
Transition Care Management Unsuccessful Follow-up Telephone Call  Date of discharge and from where:  10/28/2020 from [facility]  Attempts:  2nd Attempt  Reason for unsuccessful TCM follow-up call:  Left voice message

## 2020-11-01 NOTE — Telephone Encounter (Signed)
I would go ahead with sleep consult. They can decide when to do sleep study. -VRP

## 2020-11-01 NOTE — Telephone Encounter (Signed)
Called patient and informed her of Dr Richrd Humbles recommendations. She asked that sleep MD review her hospital notes before consult on 11/09/20. She will see PCP and cardiologist soon. Med list updated.  Patient verbalized understanding, appreciation.

## 2020-11-03 NOTE — Telephone Encounter (Signed)
Sleep consult can go ahead. CD

## 2020-11-05 ENCOUNTER — Other Ambulatory Visit: Payer: Self-pay

## 2020-11-05 ENCOUNTER — Ambulatory Visit: Payer: No Typology Code available for payment source | Admitting: Physician Assistant

## 2020-11-05 ENCOUNTER — Encounter: Payer: Self-pay | Admitting: Physician Assistant

## 2020-11-05 VITALS — BP 117/66 | HR 66 | Ht 66.5 in | Wt 159.0 lb

## 2020-11-05 DIAGNOSIS — I2111 ST elevation (STEMI) myocardial infarction involving right coronary artery: Secondary | ICD-10-CM | POA: Diagnosis not present

## 2020-11-05 DIAGNOSIS — G40909 Epilepsy, unspecified, not intractable, without status epilepticus: Secondary | ICD-10-CM

## 2020-11-05 DIAGNOSIS — E782 Mixed hyperlipidemia: Secondary | ICD-10-CM | POA: Diagnosis not present

## 2020-11-05 NOTE — Patient Instructions (Signed)
Carotid ultrasounds.  

## 2020-11-05 NOTE — Progress Notes (Signed)
Subjective:    Patient ID: Mary Fowler, female    DOB: [birth year], 50 y.o.   MRN: [id number]  HPI  Pt is a 50 yo female with HLD, IDA who presents to the clinic to follow up after hospital visit and admission for STEMI on 3/8 after persistent CP/epigastric pain.  Her right main coronary artery was stented. She was placed on ASA, Brilinta, metoprolol, lipitor. She has stopped smoking completely. Dr. Aldona Bar is her cardiologist. She has not had CP since discharge. She has not used nitroglycerin. She is very weak and easily SOB. Her echo in hospital below.   . LeftVentricle: Mild inferior hypokinesis.  . MitralValve: There is moderate regurgitation.  . AorticValve: Mild aortic valve regurgitation.  No significant lower extremity edema. She is supposed to start cardiac rehab but has not heard from them.   Pt is not happy with care from neurology. She feels like she is just being given medication and not followed up on. She request another referral. She did have another seizure at night while sleeping and called neurology. They started her on keppra. She is taking this. She is wanting another EEG and sleep test.   .. Active Ambulatory Problems    Diagnosis Date Noted  . 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 Mixed hyperlipidemia 04/05/2012  . DUB (dysfunctional uterine bleeding) 07/30/2019  . IDA (iron deficiency anemia) 08/01/2019  . Seizure-like activity (HCC) 07/12/2020  . Current smoker 07/12/2020  . Stress at work 07/12/2020  . ST elevation myocardial infarction involving right coronary artery (HCC) 11/05/2020  . Seizure disorder (HCC) 11/05/2020   Resolved Ambulatory Problems    Diagnosis Date Noted  . URI 07/17/2009  . CONTACT DERMATITIS&OTHER ECZEMA DUE TO PLANTS 11/04/2007   Past Medical History:  Diagnosis Date  . Seizures (HCC) 06/13/2020     Review of Systems  Constitutional: Positive for fatigue.  Respiratory: Negative for cough, chest  tightness and wheezing.   Cardiovascular: Negative for palpitations and leg swelling.  Musculoskeletal: Negative for arthralgias, back pain and joint swelling.  Neurological: Positive for seizures. Negative for dizziness.  Hematological: Negative.        Objective:   Physical Exam Vitals reviewed.  Constitutional:      Appearance: Normal appearance.  Neck:     Vascular: No carotid bruit.  Cardiovascular:     Rate and Rhythm: Normal rate and regular rhythm.     Pulses: Normal pulses.     Heart sounds: No murmur heard. No friction rub.  Pulmonary:     Effort: Pulmonary effort is normal.     Breath sounds: Normal breath sounds.  Abdominal:     General: Bowel sounds are normal. There is no distension.     Palpations: Abdomen is soft. There is no mass.     Tenderness: There is no abdominal tenderness. There is no right CVA tenderness, left CVA tenderness, guarding or rebound.  Musculoskeletal:     Cervical back: Normal range of motion.     Right lower leg: No edema.     Left lower leg: No edema.  Lymphadenopathy:     Cervical: No cervical adenopathy.  Skin:    Comments: Bruising over arms where blood drawn and stent entry assess.   Neurological:     General: No focal deficit present.     Mental Status: She is alert and oriented to person, place, and time.  Psychiatric:        Mood and Affect: Mood normal.  Assessment & Plan:  Marland KitchenMarland KitchenKrysta was seen today for hospitalization follow-up.  Diagnoses and all orders for this visit:  ST elevation myocardial infarction involving right coronary artery (HCC) -     US Carotid Duplex Bilateral  Seizure disorder (HCC) -     Ambulatory referral to Neurology  Mixed hyperlipidemia -     US Carotid Duplex Bilateral   Pt is compliant with medications. Discussed importance of medications to reduce risk of another CV event.  Continue care with cardiology.  Ordered carotid dopplers for risk assessment.  Will try to reach out  to cardio rehab for patient to get set up with appt.   Encouraged patient in not starting back smoking even causally as she has in the past.   New referral made to neurology at patients request. Continue on keppra until that appt.   Follow up in 6 months.

## 2020-11-08 ENCOUNTER — Encounter: Payer: Self-pay | Admitting: Physician Assistant

## 2020-11-08 ENCOUNTER — Telehealth: Payer: Self-pay | Admitting: Physician Assistant

## 2020-11-08 IMAGING — MG DIGITAL SCREENING BILAT W/ TOMO W/ CAD
8 series · 8 of 24 positions shown · non-contrast
Comparison: Previous exam(s).

CLINICAL DATA: Screening.

EXAM:
DIGITAL SCREENING BILATERAL MAMMOGRAM WITH TOMO AND CAD

[R MLO synth-2D]
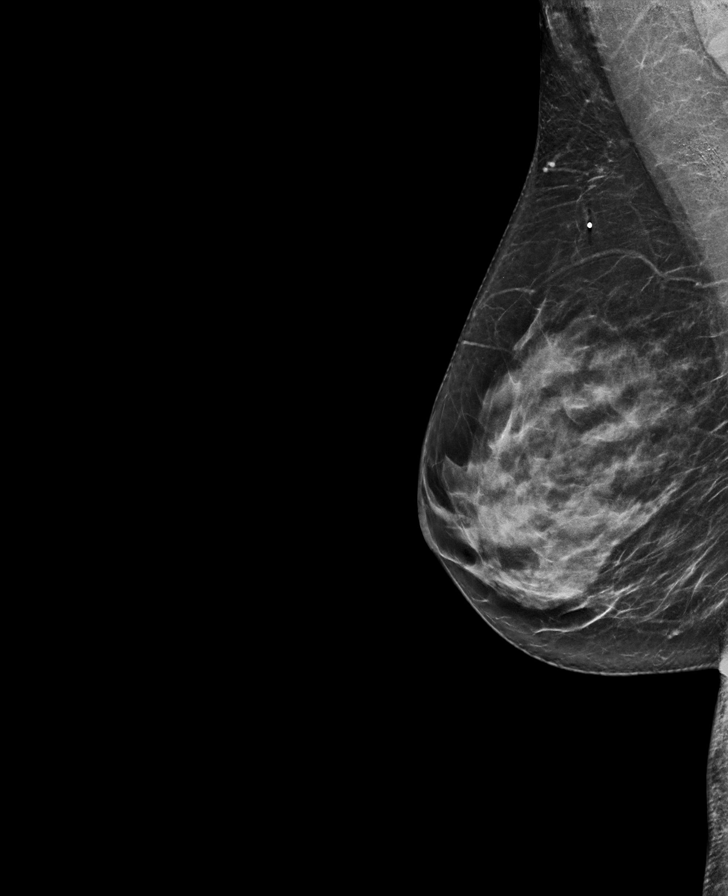

[L CC synth-2D]
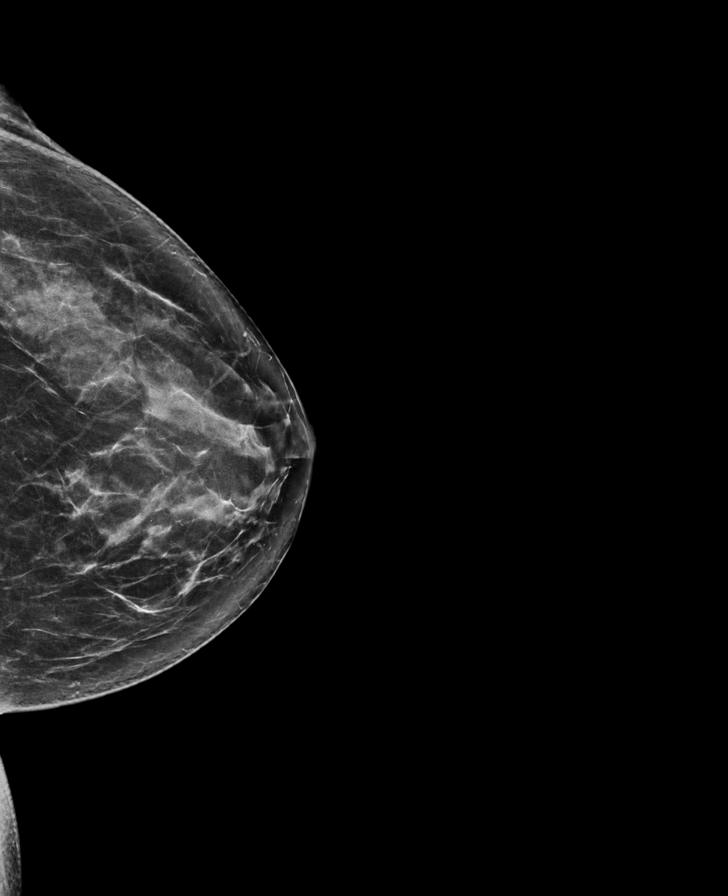

[R CC synth-2D]
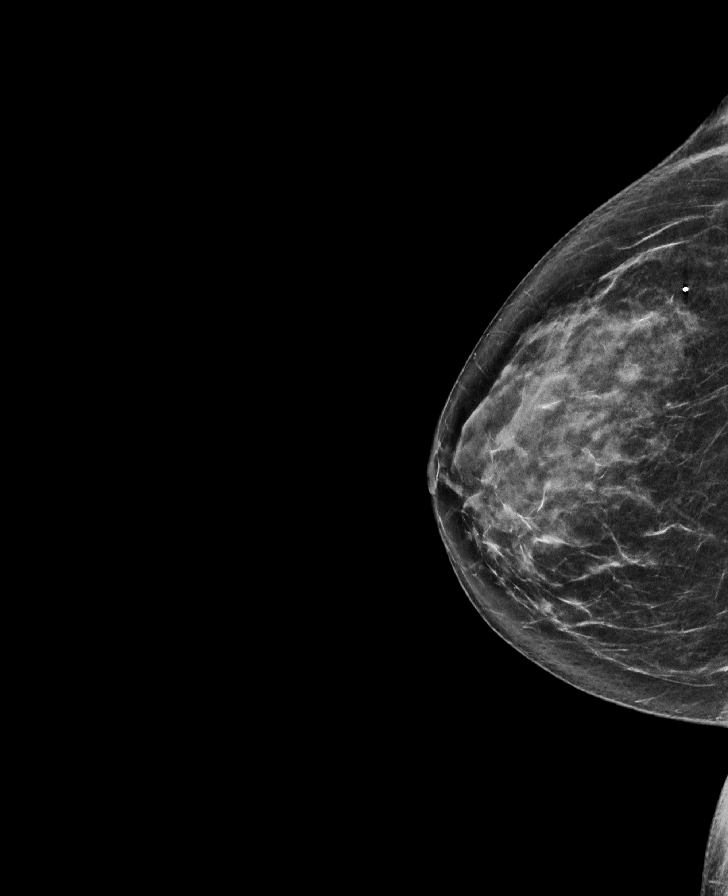

[L MLO synth-2D]
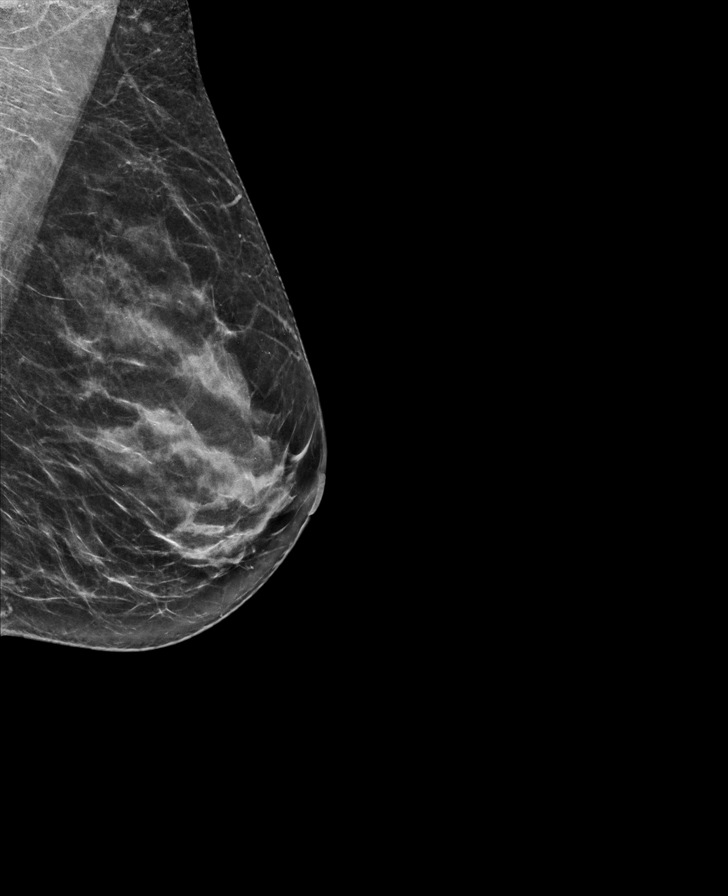

[L CC tomo · tomo slice 35/68.0]
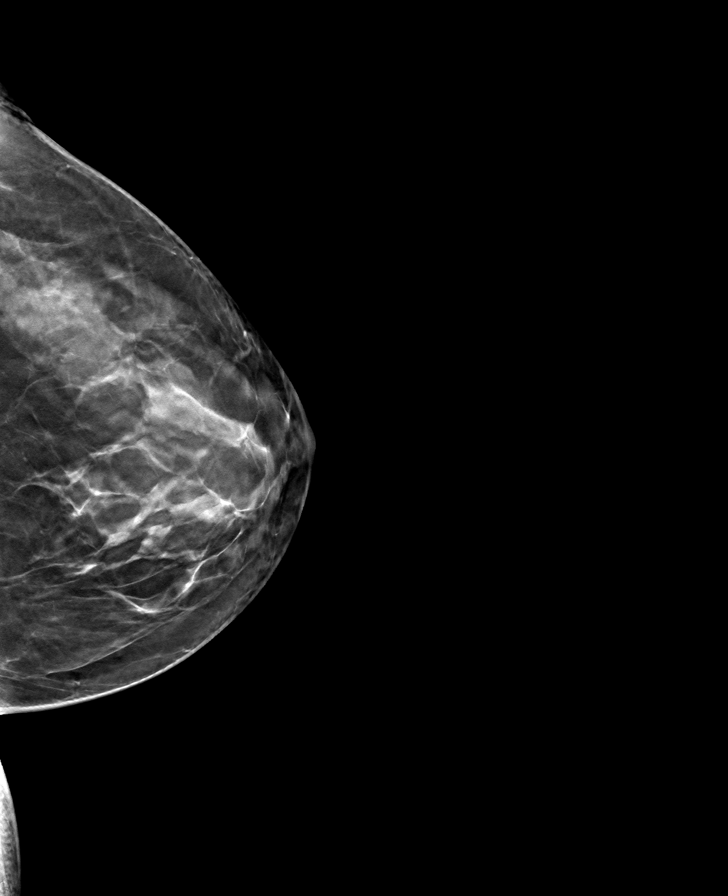

[R MLO tomo · tomo slice 35/68.0]
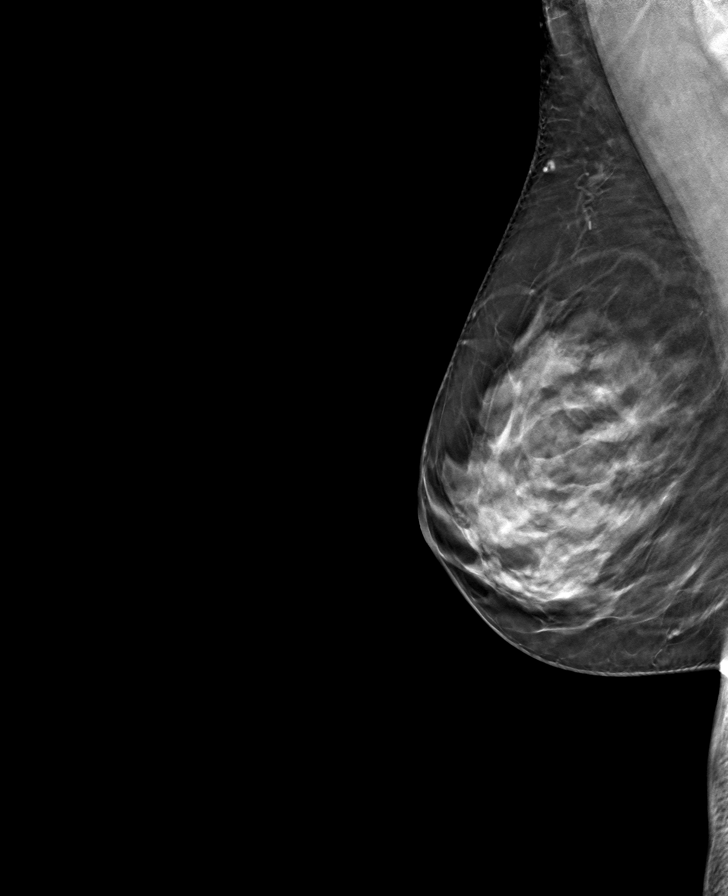

[L MLO tomo · tomo slice 33/64.0]
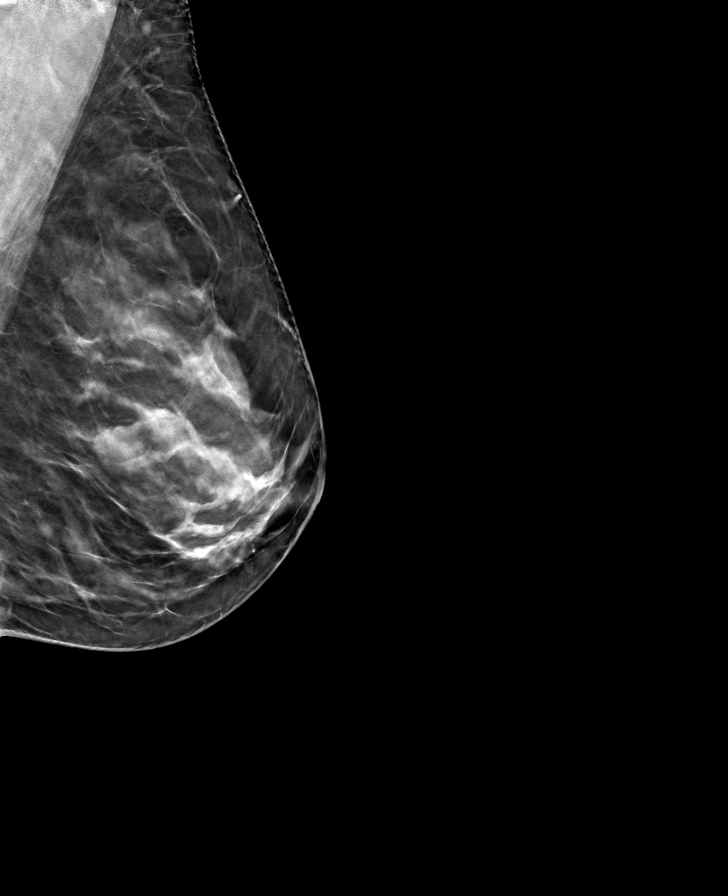

[R CC tomo · tomo slice 33/66.0]
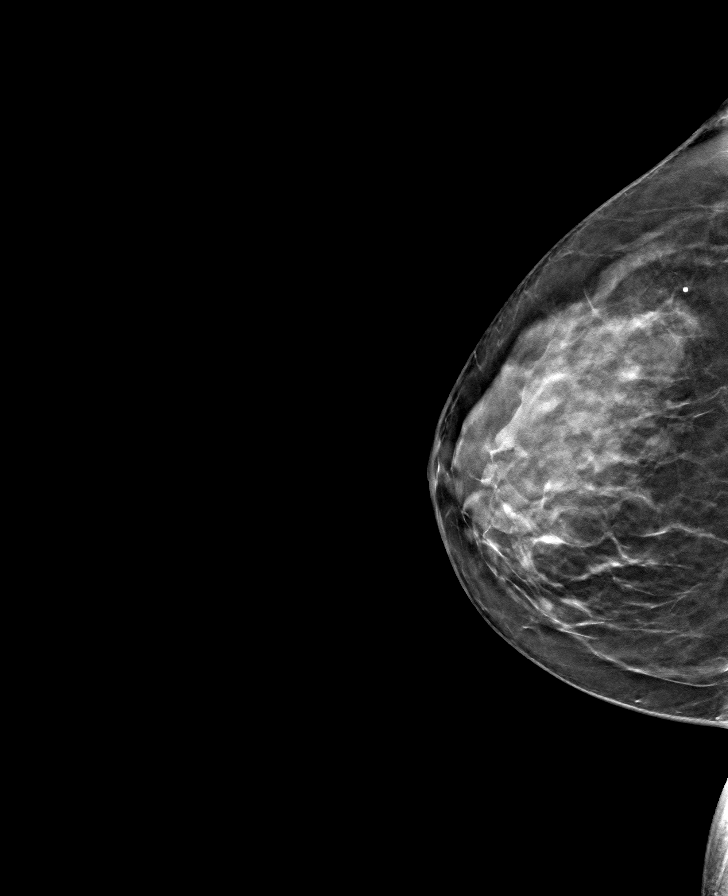

[8 of 24 positions shown; findings below may reference images not displayed]

ACR Breast Density Category c: The breast tissue is heterogeneously
dense, which may obscure small masses.
FINDINGS: There are no findings suspicious for malignancy. Images were
processed with CAD.
IMPRESSION: No mammographic evidence of malignancy. A result letter of this
screening mammogram will be mailed directly to the patient.

RECOMMENDATION:
Screening mammogram in one year. (Code:[id number]-U-LHB)

BI-RADS CATEGORY  1: Negative.

## 2020-11-08 NOTE — Telephone Encounter (Signed)
Can we look into her cardiac rehab referral made while in hospital for STEMI? She has not been called.

## 2020-11-08 NOTE — Telephone Encounter (Signed)
No we didn't if was made at hospital and she has not heard anything.

## 2020-11-08 NOTE — Telephone Encounter (Signed)
Jade   Did we make the referral? If not do you know where it was sent so I can check on it  Thank you Arline Asp

## 2020-11-09 ENCOUNTER — Encounter: Payer: Self-pay | Admitting: Neurology

## 2020-11-09 ENCOUNTER — Ambulatory Visit (INDEPENDENT_AMBULATORY_CARE_PROVIDER_SITE_OTHER): Payer: No Typology Code available for payment source | Admitting: Neurology

## 2020-11-09 VITALS — BP 119/76 | HR 71 | Ht 67.0 in | Wt 159.5 lb

## 2020-11-09 DIAGNOSIS — G4719 Other hypersomnia: Secondary | ICD-10-CM | POA: Insufficient documentation

## 2020-11-09 DIAGNOSIS — M818 Other osteoporosis without current pathological fracture: Secondary | ICD-10-CM

## 2020-11-09 DIAGNOSIS — R569 Unspecified convulsions: Secondary | ICD-10-CM

## 2020-11-09 DIAGNOSIS — F17200 Nicotine dependence, unspecified, uncomplicated: Secondary | ICD-10-CM

## 2020-11-09 DIAGNOSIS — I25110 Atherosclerotic heart disease of native coronary artery with unstable angina pectoris: Secondary | ICD-10-CM | POA: Insufficient documentation

## 2020-11-09 DIAGNOSIS — D508 Other iron deficiency anemias: Secondary | ICD-10-CM | POA: Diagnosis not present

## 2020-11-09 DIAGNOSIS — I2111 ST elevation (STEMI) myocardial infarction involving right coronary artery: Secondary | ICD-10-CM

## 2020-11-09 NOTE — Patient Instructions (Signed)
Sleep Study, Adult A sleep study (polysomnogram) is a series of tests done while you are sleeping. A sleep study records your brain waves, heart rate, breathing rate, oxygen level, and eye and leg movements. A sleep study helps your health care provider:  See how well you sleep.  Diagnose a sleep disorder.  Determine how severe your sleep disorder is.  Create a plan to treat your sleep disorder. Your health care provider may recommend a sleep study if you:  Feel sleepy on most days.  Snore loudly while sleeping.  Have unusual behaviors while you sleep, such as walking.  Have brief periods in which you stop breathing during sleep (sleepapnea).  Fall asleep suddenly during the day (narcolepsy).  Have trouble falling asleep or staying asleep (insomnia).  Feel like you need to move your legs when trying to fall asleep (restless legs syndrome).  Move your legs by flexing and extending them regularly while asleep (periodic limb movement disorder).  Act out your dreams while you sleep (sleep behavior disorder).  Feel like you cannot move when you first wake up (sleep paralysis). What tests are part of a sleep study? Most sleep studies record the following during sleep:  Brain activity.  Eye movements.  Heart rate and rhythm.  Breathing rate and rhythm.  Blood-oxygen level.  Blood pressure.  Chest and belly movement as you breathe.  Arm and leg movements.  Snoring or other noises.  Body position. Where are sleep studies done? Sleep studies are done at sleep centers. A sleep center may be inside a hospital, office, or clinic. The room where you have the study may look like a hospital room or a hotel room. The health care providers doing the study may come in and out of the room during the study. Most of the time, they will be in another room monitoring your test as you sleep. How are sleep studies done? Most sleep studies are done during a normal period of time for a  full night of sleep. You will arrive at the study center in the evening and go home in the morning. Before the test  Bring your pajamas and toothbrush with you to the sleep study.  Do not have caffeine on the day of your sleep study.  Do not drink alcohol on the day of your sleep study.  Your health care provider will let you know if you should stop taking any of your regular medicines before the test. During the test  Round, sticky patches with sensors attached to recording wires (electrodes) are placed on your scalp, face, chest, and limbs.  Wires from all the electrodes and sensors run from your bed to a computer. The wires can be taken off and put back on if you need to get out of bed to go to the bathroom.  A sensor is placed over your nose to measure airflow.  A finger clip is put on your finger or ear to measure your blood oxygen level (pulse oximetry).  A belt is placed around your belly and a belt is placed around your chest to measure breathing movements.  If you have signs of the sleep disorder called sleep apnea during your test, you may get a treatment mask to wear for the second half of the night. ? The mask provides positive airway pressure (PAP) to help you breathe better during sleep. This may greatly improve your sleep apnea. ? You will then have all tests done again with the mask in place to  see if your measurements and recordings change.      After the test  A medical doctor who specializes in sleep will evaluate the results of your sleep study and share them with you and your primary health care provider.  Based on your results, your medical history, and a physical exam, you may be diagnosed with a sleep disorder, such as: ? Sleep apnea. ? Restless legs syndrome. ? Sleep-related behavior disorder. ? Sleep-related movement disorders. ? Sleep-related seizure disorders.  Your health care team will help determine your treatment options based on your diagnosis.  This may include: ? Improving your sleep habits (sleep hygiene). ? Wearing a continuous positive airway pressure (CPAP) or bi-level positive airway pressure (BPAP) mask. ? Wearing an oral device at night to improve breathing and reduce snoring. ? Taking medicines. Follow these instructions at home:  Take over-the-counter and prescription medicines only as told by your health care provider.  If you are instructed to use a CPAP or BPAP mask, make sure you use it nightly as directed.  Make any lifestyle changes that your health care provider recommends.  If you were given a device to open your airway while you sleep, use it only as told by your health care provider.  Do not use any tobacco products, such as cigarettes, chewing tobacco, and e-cigarettes. If you need help quitting, ask your health care provider.  Keep all follow-up visits as told by your health care provider. This is important. Summary  A sleep study (polysomnogram) is a series of tests done while you are sleeping. It shows how well you sleep.  Most sleep studies are done over one full night of sleep. You will arrive at the study center in the evening and go home in the morning.  If you have signs of the sleep disorder called sleep apnea during your test, you may get a treatment mask to wear for the second half of the night.  A medical doctor who specializes in sleep will evaluate the results of your sleep study and share them with your primary health care provider. This information is not intended to replace advice given to you by your health care provider. Make sure you discuss any questions you have with your health care provider. Document Revised: 09/12/2019 Document Reviewed: 09/04/2017 Elsevier Patient Education  2021 Elsevier Inc. Restless Legs Syndrome Restless legs syndrome is a condition that causes uncomfortable feelings or sensations in the legs, especially while sitting or lying down. The sensations usually  cause an overwhelming urge to move the legs. The arms can also sometimes be affected. The condition can range from mild to severe. The symptoms often interfere with a person's ability to sleep. What are the causes? The cause of this condition is not known. What increases the risk? The following factors may make you more likely to develop this condition:  Being older than 50.  Pregnancy.  Being a woman. In general, the condition is more common in women than in men.  A family history of the condition.  Having iron deficiency.  Overuse of caffeine, nicotine, or alcohol.  Certain medical conditions, such as kidney disease, Parkinson's disease, or nerve damage.  Certain medicines, such as those for high blood pressure, nausea, colds, allergies, depression, and some heart conditions. What are the signs or symptoms? The main symptom of this condition is uncomfortable sensations in the legs, such as:  Pulling.  Tingling.  Prickling.  Throbbing.  Crawling.  Burning. Usually, the sensations:  Affect both sides  of the body.  Are worse when you sit or lie down.  Are worse at night. These may wake you up or make it difficult to fall asleep.  Make you have a strong urge to move your legs.  Are temporarily relieved by moving your legs. The arms can also be affected, but this is rare. People who have this condition often have tiredness during the day because of their lack of sleep at night. How is this diagnosed? This condition may be diagnosed based on:  Your symptoms.  Blood tests. In some cases, you may be monitored in a sleep lab by a specialist (a sleep study). This can detect any disruptions in your sleep. How is this treated? This condition is treated by managing the symptoms. This may include:  Lifestyle changes, such as exercising, using relaxation techniques, and avoiding caffeine, alcohol, or tobacco.  Medicines. Anti-seizure medicines may be tried  first. Follow these instructions at home: General instructions  Take over-the-counter and prescription medicines only as told by your health care provider.  Use methods to help relieve the uncomfortable sensations, such as: ? Massaging your legs. ? Walking or stretching. ? Taking a cold or hot bath.  Keep all follow-up visits as told by your health care provider. This is important. Lifestyle  Practice good sleep habits. For example, go to bed and get up at the same time every day. Most adults should get 7-9 hours of sleep each night.  Exercise regularly. Try to get at least 30 minutes of exercise most days of the week.  Practice ways of relaxing, such as yoga or meditation.  Avoid caffeine and alcohol.  Do not use any products that contain nicotine or tobacco, such as cigarettes and e-cigarettes. If you need help quitting, ask your health care provider.      Contact a health care provider if:  Your symptoms get worse or they do not improve with treatment. Summary  Restless legs syndrome is a condition that causes uncomfortable feelings or sensations in the legs, especially while sitting or lying down.  The symptoms often interfere with a person's ability to sleep.  This condition is treated by managing the symptoms. You may need to make lifestyle changes or take medicines. This information is not intended to replace advice given to you by your health care provider. Make sure you discuss any questions you have with your health care provider. Document Revised: 09/26/2019 Document Reviewed: 08/27/2017 Elsevier Patient Education  2021 [employer].

## 2020-11-09 NOTE — Progress Notes (Signed)
SLEEP MEDICINE CLINIC    Provider:  Melvyn Novas, MD  Primary Care Physician:  Nolene Ebbs [address] [place] [address]     Referring Provider: Nolene Ebbs [address] [place],  [place] [address]          Chief Complaint according to patient   Patient presents with:     New Patient (Initial Visit)           HISTORY OF PRESENT ILLNESS:  Mary Fowler is a 51 - year old  Caucasian female patient seen here upon  referral on 11/09/2020 from Dr Marjory Lies,     I have the pleasure of seeing Mary Fowler today, a right -handed White or Caucasian female with a possible sleep disorder. She  has a past medical history of Seizures (HCC) (06/13/2020).     HISTORY OF PRESENT ILLNESS per Dr Marjory Lies :   50 year old female here for evaluation of new onset seizure.  06/13/2020 patient was asleep when all of a sudden she made strange noises, and her husband came from the other room.  Her eyes were rolled back and she was breathing heavily.  Her body was stiffened up.  No tongue biting.  There may have been urinary incontinence.  Patient was confused afterwards, but able to stand up and walk around with assistance from husband.  He had called 911 and they came to her home for evaluation.  She was taken to the hospital.  She does not remember events clearly afterwards.  CT, MRI and EEG were obtained which were unremarkable.  Patient was diagnosed with new onset seizure.  Antiseizure medications were not started as this was the only episode she has had of seizure activity.  She did have 2 other episodes were felt to be more likely syncopal events, with one episode at age 25 years old and one episode at age 65 years old.  No significant triggering aggravating factors prior to the event in October 2021 other than patient and husband were doing fairly strenuous yard work earlier that day.  October 26 2020 she had a heart attack, and a  stent was placed. She is now not nearly as tired !  The patient had the first sleep study at age 7/ 13.    Sleep relevant medical history: Sleep talker, snoring, no Tonsillectomy, no ENT surgery-  CAD - with angina- angioplasty 10/2020. 90% blockage in right coronary artery.   Family medical /sleep history: No other family member on CPAP with OSA, insomnia, sleep walkers.   Social history:  Patient is working as Dispensing optician-  and lives in a household with Volume of work is up-   Family status is married, college age daughter is away .  Co, donkey, and a dog.  Tobacco use- active, 5 cigarettes or less a day .  ETOH use ; none since seizure-  Caffeine intake in form of Coffee( 3 cups a day) Soda(/) Tea ( 1 glass a day) . Regular exercise in form of walking. .     Sleep habits are as follows: The patient's dinner time is between 7-8.30 PM.  The patient goes to bed at 10  PM and continues to sleep for 2-3  hours, wakes for one bathroom break. The preferred sleep position is variable - not prone. Husband stated she sleeps supine , with the support of 1-2 pillows. Coughing at night.  Dreams are reportedly frequent/vivid.[name].  7 AM is the usual rise time. The patient wakes up spontaneously at 6- 6.30.  She reports not feeling refreshed or restored in AM, with symptoms such as dry mouth, coughing, phlegm,  and residual fatigue. Naps are taken frequently since her seizures- she now avoids naps.    Review of Systems: Out of a complete 14 system review, the patient complains of only the following symptoms, and all other reviewed systems are negative.:  Fatigue, sleepiness , snoring, restless.   She has more exercise tolerance.    How likely are you to doze in the following situations: 0 = not likely, 1 = slight chance, 2 = moderate chance, 3 = high chance   Sitting and Reading? Watching Television? Sitting inactive in a public place (theater or meeting)? As a passenger in a  car for an hour without a break? Lying down in the afternoon when circumstances permit? Sitting and talking to someone? Sitting quietly after lunch without alcohol? In a car, while stopped for a few minutes in traffic?   Total = was 18*/ 24 points before angioplasty and now 14 / 24   FSS endorsed at 60/ 63 points. Now 27/ 63  since angioplasty.   Social History   Socioeconomic History   Marital status: Married    Spouse name: Mary Fowler   Number of children: 1   Years of education: Not on file   Highest education level: Bachelor's degree (e.g., BA, AB, BS)  Occupational History   Occupation: Careers information officerrealestate paralegal.     Employer: [employer]    Comment: Works on [employer] for [employer].   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  Other Topics Concern   Not on file  Social History Narrative   Walks for exercise. Swimming.     Lives with husband   Caffeine 2 a day   Social Determinants of Health   Financial Resource Strain: Not on file  Food Insecurity: Not on file  Transportation Needs: Not on file  Physical Activity: Not on file  Stress: Not on file  Social Connections: Not on file    Family History  Problem Relation Age of Onset   Hypothyroidism Mother    Hyperlipidemia Mother    Anxiety disorder Sister    Diabetes Maternal Grandmother     Past Medical History:  Diagnosis Date   Seizures (HCC) 06/13/2020    Past Surgical History:  Procedure Laterality Date   OTHER SURGICAL HISTORY     stent in heart     Current Outpatient Medications on File Prior to Visit  Medication Sig Dispense  Refill   aspirin 81 MG EC tablet Take 81 mg by mouth daily.     atorvastatin (LIPITOR) 80 MG tablet Take 80 mg by mouth daily.     BRILINTA 90 MG TABS tablet Take 90 mg by mouth 2 (two) times daily.     levETIRAcetam (KEPPRA) 250 MG tablet Take 250 mg by mouth daily.     metoprolol succinate (TOPROL-XL) 25 MG 24 hr tablet  Take 12.5 mg by mouth daily.     nitroGLYCERIN (NITROSTAT) 0.4 MG SL tablet Place under the tongue.     No current facility-administered medications on file prior to visit.    Allergies  Allergen Reactions   Codeine Nausea Only    Physical exam:  Today's Vitals   11/09/20 1512  BP: 119/76  Pulse: 71  Weight: 159 lb 8 oz (72.3 kg)  Height: 5\' 7"  (1.702 m)   Body mass index is 24.98 kg/m.   Wt Readings from Last 3 Encounters:  11/09/20 159 lb 8 oz (72.3 kg)  11/05/20 159 lb (72.1 kg)  09/27/20 160 lb 3.2 oz (72.7 kg)     Ht Readings from Last 3 Encounters:  11/09/20 5\' 7"  (1.702 m)  11/05/20 5' 6.5" (1.689 m)  09/27/20 5' 6.5" (1.689 m)      General: The patient is awake, alert and appears not in acute distress. The patient is well groomed. Head: Normocephalic, atraumatic. Neck is supple.  Mallampati  ,  neck circumference:15.5  inches.  Nasal airflow patent.  Retrognathia is not seen.  Dental status: bridge, crowns , missing teeth.  Cardiovascular:  Regular rate and cardiac rhythm by pulse,  without distended neck veins. Respiratory: Lungs are clear to auscultation.  Skin:  Without evidence of ankle edema,facial erythema.  Trunk: The patient's posture is erect.   Neurologic exam : The patient is awake and alert, oriented to place and time.   Memory subjective described as intact.  Attention span & concentration ability appears normal.  Speech is fluent,  without  dysarthria, dysphonia or aphasia.  Mood and affect are appropriate.   Cranial nerves: no loss of smell or taste reported  Pupils are equal and briskly reactive to light.  Funduscopic exam deferred.   Extraocular movements in vertical and horizontal planes were intact and without nystagmus.  No Diplopia. Visual fields by finger perimetry are intact. Hearing was intact to soft voice and finger rubbing.    Facial sensation intact to fine touch.  Facial motor strength is symmetric and tongue and uvula move midline.  Neck ROM : rotation, tilt and flexion extension were normal for age and shoulder shrug was symmetrical.    Motor exam:  Symmetric bulk, tone and ROM.   Normal tone without cog wheeling, symmetric grip strength .   Sensory:  Fine touch, pinprick and vibration were tested  and  normal.  Proprioception tested in the upper extremities was normal.   Coordination: Rapid alternating movements in the fingers/hands were of normal speed.  The Finger-to-nose maneuver was intact without evidence of ataxia, dysmetria or tremor.   Gait and station: Patient could rise unassisted from a seated position, walked without assistive device.  Stance is of normal width/ base and the patient turned with 3 steps.  Toe and heel walk were deferred.  Deep tendon reflexes: in the  upper and lower extremities are symmetric and intact.  Babinski response was deferred.    After spending a total time of  45 minutes face to face and additional time for physical and neurologic examination, review of laboratory studies,  personal review of imaging studies, reports and results of other testing and review of referral information / records as far as provided in visit, I have established the following assessments:  1) The patient has had an excessive amount of daytime sleepiness when she finally came down with a severe chest pain and was diagnosed with a right coronary artery stenosis by 90%.  Angioplasty and stent placement have almost immediately relieved her of some of the sleepiness and fatigue.  She is building up her exercise tolerance now, and she has not returned to work yet.   Her work environment has been perceived as very stressful and she has relapsed into smoking more when she is under stress at work. Given that fatigue and sleepiness were certainly partially related to the coronary artery disease until discovered and treated I also wonder if her seizures have something to do with coronary artery disease as well.    She has very high cholesterol and very low ferritin- CAD and RLS risk factors.   She had a new onset seizure at age 73 which is highly unusual.  All her spells occurred when she was asleep.  One time in a nap on one time at night or in the very early morning hours.  She has avoided taking naps now.  In order to address both these interesting conditions I would like to invite her not for home sleep test but for an attended sleep study.  This way I can not just see a heart rate but a heart rhythm which I cannot follow on a home sleep test and I can perform the sleep study with a full EEG.  Our goal is to see which sleep stage may introduce a higher seizure potential.  We will also of course check for apnea.    My Plan is to proceed with:  1) attended Sleep study for EEG and and heart rhythm monitoring.  Of not permitted by [facility], will start with a HST to rule out apnea first, then pursue from there. 2) Lipitor myopathy/ myositis.  3) Ferritin is very low, RLS confirmed, I like for her to take oral iron.    I would like to thank Dr Marjory Lies, MD,  [facility] [address] [place],  [place] [address] for allowing me to meet with and to take care of this pleasant patient.   In short, GUSSIE MURTON is presenting with  I plan to follow up either personally or through our NP within 2-3 month.     Electronically signed by: Melvyn Novas, MD 11/09/2020 3:39 PM  [facility] Sleep Board certified by The [employer] of Sleep Medicine and Diplomate of the [employer] of Sleep Medicine. Board  certified In Neurology through the ABPN, Fellow of the [employer] of Neurology. Medical Director of [facility].

## 2020-11-10 NOTE — Progress Notes (Signed)
Confirm how much vitamin D she is taking so we can increase to get her to where she needs to be.

## 2020-11-10 NOTE — Progress Notes (Signed)
Normal muscle enzymes. Still low vit D level-

## 2020-11-11 ENCOUNTER — Telehealth: Payer: Self-pay | Admitting: Neurology

## 2020-11-11 NOTE — Telephone Encounter (Signed)
-----   Message from Melvyn Novas, MD sent at 11/10/2020  1:24 PM EDT ----- Normal muscle enzymes. Still low vit D level-

## 2020-11-11 NOTE — Telephone Encounter (Signed)
Called the patient advised that the muscle enzymes were normal and that she had low vitamin D.  Advised that the primary care had attempted to reach out in regards to vitamin D.  Informed the patient to contact them to determine what strength vitamin D she should be on moving forward.  Patient verbalized understanding and had no further questions at this time.

## 2020-11-12 NOTE — Progress Notes (Signed)
D3 2000 units daily.    

## 2020-11-17 LAB — VITAMIN D 1,25 DIHYDROXY
Vitamin D 1, 25 (OH)2 Total: 35 pg/mL
Vitamin D2 1, 25 (OH)2: <10 pg/mL
Vitamin D3 1, 25 (OH)2: 35 pg/mL

## 2020-11-17 LAB — CK TOTAL AND CKMB (NOT AT ARMC)
CK-MB Index: 1 ng/mL (ref 0.0–5.3)
Total CK: 62 U/L (ref 32–182)

## 2020-11-17 LAB — VITAMIN D 25 HYDROXY (VIT D DEFICIENCY, FRACTURES): Vit D, 25-Hydroxy: 21.7 ng/mL — ABNORMAL LOW (ref 30.0–100.0)

## 2020-11-17 NOTE — Progress Notes (Signed)
Normal muscle CK- enzymes, no indication of myositis. Subtype Vit D 2 was very low, overall vit D 25 in low range.  PCP has already initiated supplementation.,

## 2020-11-22 ENCOUNTER — Telehealth: Payer: Self-pay | Admitting: Neurology

## 2020-11-22 NOTE — Telephone Encounter (Signed)
Called the patient and advised her of the lab results. Informed her of the findings. Her PCP has started vitamin d treatmen. Pt verbalized understanding. Pt had no questions at this time but was encouraged to call back if questions arise.

## 2020-11-22 NOTE — Telephone Encounter (Signed)
-----   Message from Melvyn Novas, MD sent at 11/17/2020  4:50 PM EDT ----- Normal muscle CK- enzymes, no indication of myositis. Subtype Vit D 2 was very low, overall vit D 25 in low range.  PCP has already initiated supplementation.,

## 2020-12-01 ENCOUNTER — Ambulatory Visit (INDEPENDENT_AMBULATORY_CARE_PROVIDER_SITE_OTHER): Payer: No Typology Code available for payment source | Admitting: Neurology

## 2020-12-01 DIAGNOSIS — G4719 Other hypersomnia: Secondary | ICD-10-CM

## 2020-12-01 DIAGNOSIS — I25110 Atherosclerotic heart disease of native coronary artery with unstable angina pectoris: Secondary | ICD-10-CM

## 2020-12-01 DIAGNOSIS — G4733 Obstructive sleep apnea (adult) (pediatric): Secondary | ICD-10-CM | POA: Diagnosis not present

## 2020-12-01 DIAGNOSIS — D508 Other iron deficiency anemias: Secondary | ICD-10-CM

## 2020-12-01 DIAGNOSIS — R569 Unspecified convulsions: Secondary | ICD-10-CM

## 2020-12-01 DIAGNOSIS — I2111 ST elevation (STEMI) myocardial infarction involving right coronary artery: Secondary | ICD-10-CM

## 2020-12-01 DIAGNOSIS — F17200 Nicotine dependence, unspecified, uncomplicated: Secondary | ICD-10-CM

## 2020-12-02 NOTE — Progress Notes (Signed)
[facility]  HOME SLEEP TEST (Watch PAT)  STUDY DATE: 12/02/20  DOB: 09-22-1970  MRN: [id number]  ORDERING CLINICIAN: Camren Dohmeier, MD   REFERRING CLINICIAN: Jomarie Longs, PA-C   CLINICAL INFORMATION/HISTORY:This pleasant 50 year old female is seen for evaluation of new onset seizure.  06/13/2020: patient was asleep when all of a sudden she made strange noises, and her husband came in from outside the room. He described that her eyes were rolled back and she was breathing heavily.  Her body was stiff .  No tongue bite.  There may have been urinary incontinence.  Patient was confused afterwards, but able to stand up and walk around with assistance from husband.  He had called 911 and EMS took her to the hospital.  She does not remember events clearly afterwards. Head CT, MRI and EEG were obtained which were unremarkable.  Patient was diagnosed with new onset seizure.  Antiseizure medications were not started as this was the only episode she has had of seizure activity.  She did (REMOTE)  have 2 other episodes which  were felt to be representing more likely syncopal events, with one episode at age 41 years old and one episode at age 34 years old. She endorsed extreme sleepiness, not sure if from medication.   Epworth sleepiness score: 18/24  BMI: 24.9 kg/m Neck Circumference: 15.5 "  FINDINGS:   Total Record Time (hours, min): 8 h Total Sleep Time (hours, min):   7 h 41 min  Percent REM (%):    18.63 %   Calculated pAHI (per hour): 15.1       REM pAHI: 20.6  NREM pAHI: 13.8 Supine AHI:13.7   Oxygen Saturation (%) Mean: 93  Minimum oxygen saturation (%):        85   O2 Saturation Range (%): 85-99  O2Saturation (minutes) <=88%: 0.1 min  Pulse Mean (bpm):    71  Pulse Range (54-95)   IMPRESSION: This HST documented an AHI of 15.1/h consistent with mild- moderate OSA (obstructive sleep apnea) , neither depending on position nor on REM sleep. There was no  prolonged hypoxemia noted.    RECOMMENDATION:  The excessive daytime sleepiness may benefit from treatment of this mild apnea. I would start with humidified auto-CPAP at 5-15 cm water pressure window, with 2 cm EPR. The patient can choose the mask of her comfort- may need a chin strap. DME to educate on compliance and the possibility to exchange interfaces at no costs within 30 days.   PS: HST is not able to distinguish cardiac rhythm, restless legs / PLMs or hypoventilation.  INTERPRETING PHYSICIAN:  Melvyn Novas, MD Board Certified in Neurology and Sleep Medicine  [facility] [address], [address] [place], [place] [address] [phone]    Sleep Summary  Oxygen Saturation Statistics   Start Study Time: End Study Time: Total Recording Time:     10:10:09 PM 6:20:50 AM 8 h, 10 min  Total Sleep Time % REM of Sleep Time:  7 h, 41 min  18.6    Mean: 93 Minimum: 85 Maximum: 99  Mean of Desaturations Nadirs (%):   92  Oxygen Desatur. %: 4-9 10-20 >20 Total  Events Number Total  23 100.0  0 0.0  0 0.0  23 100.0  Oxygen Saturation: <90 <=88 <85 <80 <70  Duration (minutes): Sleep % 0.1 0.1 0.0 0.0 0.0 0.0 0.0 0.0 0.0 0.0     Respiratory Indices  Total Events REM NREM All Night  pRDI: pAHI 3%: ODI 4%: pAHIc 3%: % CSR: pAHI 4%: 140 115 23  16    0.0 36 24.1 20.6 5.7 0.0 17.1 13.8 2.4 3.2 18.4 15.1 3.0 2.6 4.7       Pulse Rate Statistics during Sleep (BPM)      Mean: 71 Minimum: 54 Maximum: 95    Indices are calculated using technically valid sleep time of  7 hrs, 37 min.                           pAHI=15.1                                    Mild              Moderate                    Severe                                                 5              15                    30   Body Position Statistics  Position Supine Prone Right Left Non-Supine  Sleep (min) 185.5 195.0 67.2 14.0 276.2  Sleep % 40.2 42.2  14.5 3.0 59.8  pRDI 17.3 13.6 28.5 51.7 19.1  pAHI 3% 13.7 11.1 24.8 43.1 16.0  ODI 4% 2.3 1.5 6.4 17.2 3.5               Left  Prone  Right  Supine    Snoring Statistics Snoring Level (dB) >40 >50 >60 >70 >80 >Threshold (45)  Sleep (min) 391.9 4.4 1.5 0.0 0.0 6.5  Sleep % 84.9 1.0 0.3 0.0 0.0 1.4    Mean: 41 dB Sleep Stages Chart

## 2020-12-14 ENCOUNTER — Encounter: Payer: Self-pay | Admitting: Neurology

## 2020-12-14 ENCOUNTER — Encounter: Payer: Self-pay | Admitting: Physician Assistant

## 2020-12-14 ENCOUNTER — Telehealth: Payer: Self-pay | Admitting: Neurology

## 2020-12-14 DIAGNOSIS — G4733 Obstructive sleep apnea (adult) (pediatric): Secondary | ICD-10-CM | POA: Insufficient documentation

## 2020-12-14 NOTE — Telephone Encounter (Signed)
-----   Message from Melvyn Novas, MD sent at 12/14/2020 12:35 PM EDT ----- IMPRESSION: This HST documented an AHI of 15.1/h consistent with mild- moderate OSA (obstructive sleep apnea) , neither depending on position nor on REM sleep. There was no prolonged hypoxemia noted.    RECOMMENDATION:  The excessive daytime sleepiness may benefit from treatment of this mild apnea. I would start with humidified auto-CPAP at 5-15 cm water pressure window, with 2 cm EPR. The patient can choose the mask of her comfort- may need a chin strap. DME to educate on compliance and the possibility to exchange interfaces at no costs within 30 days.   PS: HST is not able to distinguish cardiac rhythm, restless legs / PLMs or hypoventilation.  INTERPRETING PHYSICIAN:  Melvyn Novas, MD

## 2020-12-14 NOTE — Progress Notes (Signed)
Pt home sleep test did show mild to moderate sleep apnea. Pt needs to be set up with CPAP automated humidified at 5-15cm water pressure window with 2cm EPR.   Follow up in 30 days after start using the CPAP.

## 2020-12-14 NOTE — Telephone Encounter (Signed)
I called Mary Fowler. I advised Mary Fowler that Dr. Vickey Huger reviewed their sleep study results and found that Mary Fowler has moderate sleep apnea. Dr. Vickey Huger recommends that Mary Fowler starts auto CPAP. I reviewed PAP compliance expectations with the Mary Fowler. Mary Fowler is agreeable to starting a CPAP. I advised Mary Fowler that an order will be sent to a DME, [facility] and [facility]  will call the Mary Fowler within about one week after they file with the Mary Fowler's insurance. [facility])  will show the Mary Fowler how to use the machine, fit for masks, and troubleshoot the CPAP if needed. A follow up appt was made for insurance purposes with Dr. Vickey Huger on Sept 12,2022 at 3:30 pm. Mary Fowler verbalized understanding to arrive 15 minutes early and bring their CPAP. A letter with all of this information in it will be mailed to the Mary Fowler as a reminder. I verified with the Mary Fowler that the address we have on file is correct. Mary Fowler verbalized understanding of results. Mary Fowler had no questions at this time but was encouraged to call back if questions arise. I have sent the order to [facility])  and have received confirmation that they have received the order.

## 2020-12-14 NOTE — Procedures (Signed)
 [facility]  HOME SLEEP TEST (Watch PAT)  STUDY DATE: 12/02/20  DOB: 01/06/1971  MRN: [id number]  ORDERING CLINICIAN: Camren Jovante Hammitt, MD   REFERRING CLINICIAN: Breeback, Jade L, PA-C   CLINICAL INFORMATION/HISTORY:This pleasant 49-year-old female is seen for evaluation of new onset seizure.  06/13/2020: patient was asleep when all of a sudden she made strange noises, and her husband came in from outside the room. He described that her eyes were rolled back and she was breathing heavily.  Her body was stiff .  No tongue bite.  There may have been urinary incontinence.  Patient was confused afterwards, but able to stand up and walk around with assistance from husband.  He had called 911 and EMS took her to the hospital.  She does not remember events clearly afterwards. Head CT, MRI and EEG were obtained which were unremarkable.  Patient was diagnosed with new onset seizure.  Antiseizure medications were not started as this was the only episode she has had of seizure activity.  She did (REMOTE)  have 2 other episodes which  were felt to be representing more likely syncopal events, with one episode at age 13 years old and one episode at age 44 years old. She endorsed extreme sleepiness, not sure if from medication.   Epworth sleepiness score: 18/24  BMI: 24.9 kg/m Neck Circumference: 15.5 "  FINDINGS:   Total Record Time (hours, min): 8 h 40min Total Sleep Time (hours, min):   7 h 41 min  Percent REM (%):    18.63 %   Calculated pAHI (per hour): 15.1       REM pAHI: 20.6  NREM pAHI: 13.8 Supine AHI:13.7   Oxygen Saturation (%) Mean: 93  Minimum oxygen saturation (%):        85   O2 Saturation Range (%): 85-99  O2Saturation (minutes) <=88%: 0.1 min  Pulse Mean (bpm):    71  Pulse Range (54-95)   IMPRESSION: This HST documented an AHI of 15.1/h consistent with mild- moderate OSA (obstructive sleep apnea) , neither depending on position nor on REM sleep. There was no  prolonged hypoxemia noted.    RECOMMENDATION:  The excessive daytime sleepiness may benefit from treatment of this mild apnea. I would start with humidified auto-CPAP at 5-15 cm water pressure window, with 2 cm EPR. The patient can choose the mask of her comfort- may need a chin strap. DME to educate on compliance and the possibility to exchange interfaces at no costs within 30 days.   PS: HST is not able to distinguish cardiac rhythm, restless legs / PLMs or hypoventilation.  INTERPRETING PHYSICIAN:  Aws Shere, MD Board Certified in Neurology and Sleep Medicine  [facility] [address], [address] , [place] [address] [phone]    Sleep Summary  Oxygen Saturation Statistics   Start Study Time: End Study Time: Total Recording Time:     10:10:09 PM 6:20:50 AM 8 h, 10 min  Total Sleep Time % REM of Sleep Time:  7 h, 41 min  18.6    Mean: 93 Minimum: 85 Maximum: 99  Mean of Desaturations Nadirs (%):   92  Oxygen Desatur. %: 4-9 10-20 >20 Total  Events Number Total  23 100.0  0 0.0  0 0.0  23 100.0  Oxygen Saturation: <90 <=88 <85 <80 <70  Duration (minutes): Sleep % 0.1 0.1 0.0 0.0 0.0 0.0 0.0 0.0 0.0 0.0     Respiratory Indices        Total Events REM NREM All Night  pRDI: pAHI 3%: ODI 4%: pAHIc 3%: % CSR: pAHI 4%: 140 115 23  16    0.0 36 24.1 20.6 5.7 0.0 17.1 13.8 2.4 3.2 18.4 15.1 3.0 2.6 4.7       Pulse Rate Statistics during Sleep (BPM)      Mean: 71 Minimum: 54 Maximum: 95    Indices are calculated using technically valid sleep time of  7 hrs, 37 min.                           pAHI=15.1                                    Mild              Moderate                    Severe                                                 5              15                    30  Body Position Statistics  Position Supine Prone Right Left Non-Supine  Sleep (min) 185.5 195.0 67.2 14.0 276.2  Sleep % 40.2 42.2  14.5 3.0 59.8  pRDI 17.3 13.6 28.5 51.7 19.1  pAHI 3% 13.7 11.1 24.8 43.1 16.0  ODI 4% 2.3 1.5 6.4 17.2 3.5               Left  Prone  Right  Supine    Snoring Statistics Snoring Level (dB) >40 >50 >60 >70 >80 >Threshold (45)  Sleep (min) 391.9 4.4 1.5 0.0 0.0 6.5  Sleep % 84.9 1.0 0.3 0.0 0.0 1.4    Mean: 41 dB Sleep Stages Chart  

## 2020-12-14 NOTE — Progress Notes (Signed)
IMPRESSION: This HST documented an AHI of 15.1/h consistent with mild- moderate OSA (obstructive sleep apnea) , neither depending on position nor on REM sleep. There was no prolonged hypoxemia noted.    RECOMMENDATION:  The excessive daytime sleepiness may benefit from treatment of this mild apnea. I would start with humidified auto-CPAP at 5-15 cm water pressure window, with 2 cm EPR. The patient can choose the mask of her comfort- may need a chin strap. DME to educate on compliance and the possibility to exchange interfaces at no costs within 30 days.   PS: HST is not able to distinguish cardiac rhythm, restless legs / PLMs or hypoventilation.  INTERPRETING PHYSICIAN:  Melvyn Novas, MD

## 2020-12-14 NOTE — Addendum Note (Signed)
Addended by: Melvyn Novas on: 12/14/2020 12:35 PM   Modules accepted: Orders

## 2021-01-11 ENCOUNTER — Ambulatory Visit: Payer: Self-pay | Admitting: Diagnostic Neuroimaging

## 2021-02-07 ENCOUNTER — Telehealth: Payer: Self-pay | Admitting: Neurology

## 2021-02-07 NOTE — Telephone Encounter (Signed)
I have sent a message to [facility]). It looks like it would have been around 12/14/20 the order would have been sent.  I sent a message as urgent to have someone look into it. Advised if they didn't have the order could they process and get her set up asap.

## 2021-02-07 NOTE — Telephone Encounter (Signed)
The DME company has reached out to the patient and will work on getting her set up  "I called the patient and she said that she is getting a new insurance policy should be this week and she will call me back with the updated insurance infoand I will process it."

## 2021-02-07 NOTE — Telephone Encounter (Signed)
Pt called stating that she has called [facility] this morning and they informed her that they do not have any information on her. Pt is needing her information sent over so that they can get her a cpap machine. Please advise.

## 2021-02-18 NOTE — Progress Notes (Signed)
LMOM for patient to call our office back if she hasn't been set up with CPAP/supplies.

## 2021-05-02 ENCOUNTER — Ambulatory Visit: Payer: Self-pay | Admitting: Neurology

## 2021-05-09 ENCOUNTER — Ambulatory Visit (INDEPENDENT_AMBULATORY_CARE_PROVIDER_SITE_OTHER): Payer: 59 | Admitting: Physician Assistant

## 2021-05-09 ENCOUNTER — Encounter: Payer: Self-pay | Admitting: Physician Assistant

## 2021-05-09 ENCOUNTER — Other Ambulatory Visit: Payer: Self-pay

## 2021-05-09 VITALS — BP 125/76 | HR 89 | Ht 67.0 in | Wt 151.0 lb

## 2021-05-09 DIAGNOSIS — G40909 Epilepsy, unspecified, not intractable, without status epilepticus: Secondary | ICD-10-CM | POA: Diagnosis not present

## 2021-05-09 DIAGNOSIS — E785 Hyperlipidemia, unspecified: Secondary | ICD-10-CM

## 2021-05-09 DIAGNOSIS — Z1159 Encounter for screening for other viral diseases: Secondary | ICD-10-CM

## 2021-05-09 DIAGNOSIS — I25110 Atherosclerotic heart disease of native coronary artery with unstable angina pectoris: Secondary | ICD-10-CM

## 2021-05-09 DIAGNOSIS — E782 Mixed hyperlipidemia: Secondary | ICD-10-CM

## 2021-05-09 DIAGNOSIS — Z79899 Other long term (current) drug therapy: Secondary | ICD-10-CM

## 2021-05-09 DIAGNOSIS — I2111 ST elevation (STEMI) myocardial infarction involving right coronary artery: Secondary | ICD-10-CM

## 2021-05-09 DIAGNOSIS — R413 Other amnesia: Secondary | ICD-10-CM | POA: Insufficient documentation

## 2021-05-09 DIAGNOSIS — G4733 Obstructive sleep apnea (adult) (pediatric): Secondary | ICD-10-CM

## 2021-05-09 DIAGNOSIS — R4189 Other symptoms and signs involving cognitive functions and awareness: Secondary | ICD-10-CM | POA: Insufficient documentation

## 2021-05-09 NOTE — Progress Notes (Signed)
Subjective:    Patient ID: Mary Fowler, female    DOB: [birth year], 50 y.o.   MRN: [id number]  HPI Pt is a 50 yo female with seizures, hx of MI, CAD, OSA who presents to the clinic for follow up.   She sees neurology and on keppra. 6 months since last seizure she is hoping to be released to drive soon.   Still does not have CPAP due to shortage.   Cardiology doing well. Would like [name] to order lipid panel.  Compliant with lipitor, plavix, metoprolol.   She is having a little bit of memory changes and brain fog.   .. 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) 11/05/2020   Seizure disorder (HCC) 11/05/2020   New onset seizure (HCC) 11/09/2020   Coronary artery disease involving native coronary artery of native heart with unstable angina pectoris (HCC) 11/09/2020   Excessive daytime sleepiness 11/09/2020   OSA (obstructive sleep apnea) 12/14/2020   Resolved Ambulatory Problems    Diagnosis Date Noted   URI 07/17/2009   CONTACT DERMATITIS&OTHER ECZEMA DUE TO PLANTS 11/04/2007   Past Medical History:  Diagnosis Date   Seizures (HCC) 06/13/2020        Review of Systems See HPI.     Objective:   Physical Exam Vitals reviewed.  Constitutional:      Appearance: Normal appearance.  HENT:     Head: Normocephalic.  Neck:     Vascular: No carotid bruit.  Cardiovascular:     Rate and Rhythm: Normal rate and regular rhythm.     Pulses: Normal pulses.     Heart sounds: Normal heart sounds.  Pulmonary:     Effort: Pulmonary effort is normal.     Breath sounds: Normal breath sounds.  Neurological:     General: No focal  deficit present.     Mental Status: She is alert and oriented to person, place, and time.  Psychiatric:        Mood and Affect: Mood normal.   .. Depression screen [facility] 2/9 05/09/2021 07/30/2019  Decreased Interest 0 1  Down, Depressed, Hopeless 0 0  PHQ - 2 Score 0 1  Altered sleeping 0 1  Tired, decreased energy 0 1  Change in appetite 0 0  Feeling bad or failure about yourself  0 0  Trouble concentrating 0 0  Moving slowly or fidgety/restless 0 0  Suicidal thoughts 0 0  PHQ-9 Score 0 3  Difficult doing work/chores Not difficult at all Not difficult at all   .[name] GAD 7 : Generalized Anxiety Score 05/09/2021 07/30/2019  Nervous, Anxious, on Edge 0 1  Control/stop worrying 0 1  Worry too much - different things 0 0  Trouble relaxing 0 0  Restless 0 0  Easily annoyed or irritable 1 1  Afraid - awful might happen 0 1  Total GAD 7 Score 1 4  Anxiety Difficulty Not difficult at all Not difficult at all            Assessment & Plan:  Marland KitchenMarland KitchenKahlen was seen today for follow-up.  Diagnoses and all orders for this visit:  ST elevation myocardial infarction involving right coronary artery (HCC) -  Lipid Panel w/reflex Direct LDL -     Hemoglobin A1c  Seizure disorder (HCC)  Mixed hyperlipidemia -     Lipid Panel w/reflex Direct LDL  Dyslipidemia, goal LDL below 70 -     Lipid Panel w/reflex Direct LDL  Medication management -     COMPLETE METABOLIC PANEL WITH GFR  Encounter for hepatitis C screening test for low risk patient -     Hepatitis C Antibody  Coronary artery disease involving native coronary artery of native heart with unstable angina pectoris (HCC)  OSA (obstructive sleep apnea)  Brain fog  Memory changes  Fasting labs ordered.  Medications UTD.  Vitals look great.   Discussed brain fog and could be coming from seizures/medications/MI/OSA not treated. Work on [employer] and exercise. Increase omega 3s in diet. Get CPAP started. Will continue to  monitor. Discussed red flag memory changes.   Follow up in 6 months.

## 2021-05-10 LAB — LIPID PANEL W/REFLEX DIRECT LDL
Cholesterol: 143 mg/dL (ref <200)
HDL: 45 mg/dL — ABNORMAL LOW (ref >=50)
LDL Cholesterol (Calc): 82 mg/dL (calc)
Non-HDL Cholesterol (Calc): 98 mg/dL (calc) (ref <130)
Total CHOL/HDL Ratio: 3.2 (calc) (ref <5.0)
Triglycerides: 76 mg/dL (ref <150)

## 2021-05-10 LAB — COMPLETE METABOLIC PANEL WITH GFR
AG Ratio: 1.7 (calc) (ref 1.0–2.5)
ALT: 22 U/L (ref 6–29)
AST: 19 U/L (ref 10–35)
Albumin: 4.2 g/dL (ref 3.6–5.1)
Alkaline phosphatase (APISO): 58 U/L (ref 37–153)
BUN: 11 mg/dL (ref 7–25)
CO2: 27 mmol/L (ref 20–32)
Calcium: 9.3 mg/dL (ref 8.6–10.4)
Chloride: 104 mmol/L (ref 98–110)
Creat: 0.89 mg/dL (ref 0.50–1.03)
Globulin: 2.5 g/dL (calc) (ref 1.9–3.7)
Glucose, Bld: 89 mg/dL (ref 65–99)
Potassium: 4.2 mmol/L (ref 3.5–5.3)
Sodium: 139 mmol/L (ref 135–146)
Total Bilirubin: 0.8 mg/dL (ref 0.2–1.2)
Total Protein: 6.7 g/dL (ref 6.1–8.1)
eGFR: 78 mL/min/{1.73_m2} (ref >=60)

## 2021-05-10 LAB — HEPATITIS C ANTIBODY
Hepatitis C Ab: NONREACTIVE
SIGNAL TO CUT-OFF: 0.01 (ref <1.00)

## 2021-05-10 LAB — HEMOGLOBIN A1C
Hgb A1c MFr Bld: 5.3 % of total Hgb (ref <5.7)
Mean Plasma Glucose: 105 mg/dL
eAG (mmol/L): 5.8 mmol/L

## 2021-05-10 NOTE — Progress Notes (Signed)
Lianne,   Your LDL is almost to goal! When do you see cardiology again? My thoughts are adding an every 2 week injection to get you to final goal but I wanted to run it by cards.   Kidney, liver, glucose look great.

## 2021-08-18 NOTE — Telephone Encounter (Signed)
According to a email received in late November, pt had not answered called from [facility].   "I am voiding out this CPAP order. Three attempts to contact with no response."

## 2021-08-30 ENCOUNTER — Telehealth: Payer: Self-pay

## 2021-08-30 NOTE — Telephone Encounter (Signed)
Pt has been scheduled with Jade for in the morning. AM

## 2021-08-30 NOTE — Telephone Encounter (Signed)
Pt got up Saturday morning blood vessel  ruptured  in left eye, pt states no vision changes , just feels different, pt states she does not wear contacts or rub her eye, pt would like an appt.

## 2021-08-31 ENCOUNTER — Ambulatory Visit (INDEPENDENT_AMBULATORY_CARE_PROVIDER_SITE_OTHER): Payer: 59 | Admitting: Physician Assistant

## 2021-08-31 ENCOUNTER — Other Ambulatory Visit: Payer: Self-pay

## 2021-08-31 ENCOUNTER — Encounter: Payer: Self-pay | Admitting: Physician Assistant

## 2021-08-31 VITALS — BP 137/82 | HR 95 | Ht 67.0 in | Wt 153.1 lb

## 2021-08-31 DIAGNOSIS — R55 Syncope and collapse: Secondary | ICD-10-CM | POA: Diagnosis not present

## 2021-08-31 DIAGNOSIS — H1132 Conjunctival hemorrhage, left eye: Secondary | ICD-10-CM

## 2021-08-31 DIAGNOSIS — Z1211 Encounter for screening for malignant neoplasm of colon: Secondary | ICD-10-CM

## 2021-08-31 DIAGNOSIS — Z1231 Encounter for screening mammogram for malignant neoplasm of breast: Secondary | ICD-10-CM | POA: Diagnosis not present

## 2021-08-31 NOTE — Progress Notes (Signed)
Subjective:    Patient ID: Mary Fowler, female    DOB: Jun 10, 1971, 51 y.o.   MRN: [id number]  HPI [name] with CAD, hx of MI, OSA, seizure disoder who presents to the clinic fore a few concerns.   In December she saw her moms bandage for a wound and she felt really light heaed and passed out. She has done this before in response to blood or body fluids. Not happened since. Wants to know to avoid this. No head injury.   Pt is concerned about redness in her left eye. Noticed it over the weekend. Denies any pain, itching, burning, vision changes. Not aware of any injury. She has been intetmittently cough, scratching eyes. No fever, chills, sinus pressure, ear pain or sinus pressure. Not tried anything to make better.   No seizures since feb 2022. On keppra.     .. 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) 11/05/2020   Seizure disorder (HCC) 11/05/2020   New onset seizure (HCC) 11/09/2020   Coronary artery disease involving native coronary artery of native heart with unstable angina pectoris (HCC) 11/09/2020   Excessive daytime sleepiness 11/09/2020   OSA (obstructive sleep apnea) 12/14/2020   Brain fog 05/09/2021   Memory changes 05/09/2021   Subconjunctival hemorrhage of left eye 09/02/2021   Vasovagal syncope 09/02/2021   Resolved Ambulatory Problems    Diagnosis Date Noted   URI 07/17/2009   CONTACT DERMATITIS&OTHER ECZEMA DUE TO PLANTS 11/04/2007   Past Medical History:  Diagnosis Date   Seizures (HCC) 06/13/2020      Review of Systems See HPI.     Objective:   Physical Exam Vitals reviewed.   Constitutional:      Appearance: Normal appearance.  HENT:     Head: Normocephalic and atraumatic.     Right Ear: Tympanic membrane, ear canal and external ear normal. There is no impacted cerumen.     Left Ear: Tympanic membrane, ear canal and external ear normal. There is no impacted cerumen.     Nose: Nose normal.     Mouth/Throat:     Mouth: Mucous membranes are moist.     Pharynx: No posterior oropharyngeal erythema.  Eyes:     Comments: Left conjunctiva redness around iris  Cardiovascular:     Rate and Rhythm: Normal rate and regular rhythm.     Pulses: Normal pulses.     Heart sounds: Normal heart sounds.  Pulmonary:     Effort: Pulmonary effort is normal.     Breath sounds: Normal breath sounds.  Neurological:     Mental Status: She is alert.          Assessment & Plan:  Mary KitchenMarland KitchenAshle was seen today for eye pain.  Diagnoses and all orders for this visit:  Subconjunctival hemorrhage of left eye  Colon cancer screening -     Cologuard  Visit for screening mammogram -     MM 3D SCREEN BREAST BILATERAL  Vasovagal syncope   Cologuard and mammogram ordered.   Reassured about conjunctiva and how they usually improve over time. HO given. Ok to use visine. Contact office with any vision changes.  Discussed vasovagal syncope gave reassurance

## 2021-08-31 NOTE — Patient Instructions (Addendum)
Syncope, Adult °Syncope refers to a condition in which a person temporarily loses consciousness. Syncope may also be called fainting or passing out. It is caused by a sudden decrease in blood flow to the brain. This can happen for a variety of reasons. °Most causes of syncope are not dangerous. It can be triggered by things such as needle sticks, seeing blood, pain, or intense emotion. However, syncope can also be a sign of a serious medical problem, such as a heart abnormality. Other causes can include dehydration, migraines, or taking medicines that lower blood pressure. Your health care provider may do tests to find the reason why you are having syncope. °If you faint, get medical help right away. Call your local emergency services (911 in the [place] °Follow these instructions at home: °Pay attention to any changes in your symptoms. Take these actions to stay safe and to help relieve your symptoms: °Knowing when you may be about to faint °Signs that you may be about to faint include: °Feeling dizzy, weak, light-headed, or like the room is spinning. °Feeling nauseous. °Seeing spots or seeing all white or all black in your field of vision. °Having cold, clammy skin or feeling warm and sweaty. °Hearing ringing in the ears (tinnitus). °If you start to feel like you might faint, sit or lie down right away. If sitting, put your head down between your legs. If lying down, raise (elevate) your feet above the level of your heart. °Breathe deeply and steadily. Wait until all the symptoms have passed. °Have someone stay with you until you feel stable. °Medicines °Take over-the-counter and prescription medicines only as told by your health care provider. °If you are taking blood pressure or heart medicine, get up slowly and take several minutes to sit and then stand. This can reduce dizziness and decrease the risk of syncope. °Lifestyle °Do not drive, use machinery, or play sports until your health care provider says it is  okay. °Do not drink alcohol. °Do not use any products that contain nicotine or tobacco. These products include cigarettes, chewing tobacco, and vaping devices, such as e-cigarettes. If you need help quitting, ask your health care provider. °Avoid hot tubs and saunas. °General instructions °Talk with your health care provider about your symptoms. You may need to have testing to understand the cause of your syncope. °Drink enough fluid to keep your urine pale yellow. °Avoid prolonged standing. If you must stand for a long time, do movements such as: °Moving your legs. °Crossing your legs. °Flexing and stretching your leg muscles. °Squatting. °Keep all follow-up visits. This is important. °Contact a health care provider if: °You have episodes of near fainting. °Get help right away if: °You faint. °You hit your head or are injured after fainting. °You have any of these symptoms that may indicate trouble with your heart: °Fast or irregular heartbeats (palpitations). °Unusual pain in your chest, abdomen, or back. °Shortness of breath. °You have a seizure. °You have a severe headache. °You are confused. °You have vision problems. °You have severe weakness or trouble walking. °You are bleeding from your mouth or rectum, or you have black or tarry stool. °These symptoms may represent a serious problem that is an emergency. Do not wait to see if your symptoms will go away. Get medical help right away. Call your local emergency services (911 in the [place] Do not drive yourself to the hospital. °Summary °Syncope refers to a condition in which a person temporarily loses consciousness. Syncope may also be called fainting   or passing out. It is caused by a sudden decrease in blood flow to the brain. Signs that you may be about to faint include dizziness, feeling light-headed, feeling nauseous, sudden vision changes, or cold, clammy skin. Even though most causes of syncope are not dangerous, syncope can be a sign of a serious  medical problem. Get help right away if you faint. If you start to feel like you might faint, sit or lie down right away. If sitting, put your head down between your legs. If lying down, raise (elevate) your feet above the level of your heart. This information is not intended to replace advice given to you by your health care provider. Make sure you discuss any questions you have with your health care provider. Document Revised: 12/16/2020 Document Reviewed: 12/16/2020 Elsevier Patient Education  2022 Elsevier Inc.   Subconjunctival Hemorrhage Subconjunctival hemorrhage is bleeding that happens between the white part of your eye (sclera) and the clear membrane that covers the outside of your eye (conjunctiva). There are many tiny blood vessels near the surface of your eye. A subconjunctival hemorrhage happens when one or more of these vessels breaks and bleeds, causing a red patch to appear on your eye. This is similar to a bruise. Depending on the amount of bleeding, the red patch may only cover a small area of your eye or it may cover the entire visible part of the sclera. If a lot of blood collects under the conjunctiva, there may also be swelling. Subconjunctival hemorrhages do not affect your vision or cause pain, but your eye may feel irritated if there is swelling. Subconjunctival hemorrhages usually do not require treatment, and they usually disappear on their own within two to four weeks. What are the causes? This condition may be caused by: Mild trauma, such as rubbing your eye too hard. Blunt injuries, such as from playing sports or coming into contact with a deployed airbag. Coughing, sneezing, or vomiting. Straining, such as when lifting a heavy object. Medical conditions, such as: High blood pressure. Diabetes. Recent eye surgery. Certain medicines, especially blood thinners (anticoagulants), including aspirin. Other conditions, such as eye tumors, bleeding disorders, or blood  vessel abnormalities. Subconjunctival hemorrhages can also happen without an obvious cause. What are the signs or symptoms? Symptoms of this condition include: A bright red or dark red patch on the white part of the eye. The red area may: Spread out to cover a larger area of the eye before it goes away. Turn colors such as pink or brownish-yellow before it goes away. Swelling around the eye. Mild eye irritation. How is this diagnosed? This condition is diagnosed with a physical exam. If your subconjunctival hemorrhage was caused by trauma, your health care provider may refer you to an eye specialist (ophthalmologist) or another specialist to check for other injuries. You may have other tests, including: An eye exam including a vision test, checking your eye with a type of microscope (slit lamp) and measuring the pressure in your eye. Your eye may be dilated, especially if your subconjunctival hemorrhage was caused by trauma. A blood pressure check. Blood tests to check for bleeding disorders. If your subconjunctival hemorrhage was caused by trauma, X-rays or a CT scan may be done to check for other injuries. How is this treated? Usually, treatment is not needed for this condition. If you have discomfort, your health care provider may recommend eye drops or cold compresses. Follow these instructions at home: Take over-the-counter and prescription medicines only as directed  by your health care provider. Use eye drops or cold compresses to help with discomfort as directed by your health care provider. Avoid activities, things, and environments that may irritate or injure your eye. Keep all follow-up visits. This is important. Contact a health care provider if: You have pain in your eye. The bleeding does not go away within 4 weeks. You keep getting new subconjunctival hemorrhages. Get help right away if: Your vision changes, you have difficulty seeing, or you develop double vision. You  suddenly develop severe sensitivity to light. You develop a severe headache, persistent vomiting, confusion, or abnormal tiredness (lethargy). Your eye seems to bulge or protrude from your eye socket. You develop unexplained bruises on your body. You have unexplained bleeding in another area of your body. These symptoms may represent a serious problem that is an emergency. Do not wait to see if the symptoms will go away. Get medical help right away. Call your local emergency services (911 in the [place] Do not drive yourself to the hospital. Summary Subconjunctival hemorrhage is bleeding that happens between the white part of your eye and the clear membrane that covers the outside of your eye. This condition is similar to a bruise. Subconjunctival hemorrhages usually do not require treatment, and they usually disappear on their own within two to four weeks. Use eye drops or cold compresses to help with discomfort as directed by your health care provider. This information is not intended to replace advice given to you by your health care provider. Make sure you discuss any questions you have with your health care provider. Document Revised: 10/13/2020 Document Reviewed: 10/13/2020 Elsevier Patient Education  2022 [employer].

## 2021-09-02 ENCOUNTER — Encounter: Payer: Self-pay | Admitting: Physician Assistant

## 2021-09-02 DIAGNOSIS — H1132 Conjunctival hemorrhage, left eye: Secondary | ICD-10-CM | POA: Insufficient documentation

## 2021-09-02 DIAGNOSIS — R55 Syncope and collapse: Secondary | ICD-10-CM | POA: Insufficient documentation

## 2021-11-07 ENCOUNTER — Ambulatory Visit: Payer: 59 | Admitting: Physician Assistant

## 2021-12-05 ENCOUNTER — Ambulatory Visit: Payer: 59 | Admitting: Physician Assistant

## 2021-12-07 ENCOUNTER — Encounter: Payer: Self-pay | Admitting: Physician Assistant

## 2021-12-07 ENCOUNTER — Ambulatory Visit (INDEPENDENT_AMBULATORY_CARE_PROVIDER_SITE_OTHER): Payer: 59 | Admitting: Physician Assistant

## 2021-12-07 VITALS — BP 119/69 | HR 72 | Ht 67.0 in | Wt 152.0 lb

## 2021-12-07 DIAGNOSIS — E785 Hyperlipidemia, unspecified: Secondary | ICD-10-CM

## 2021-12-07 DIAGNOSIS — G4733 Obstructive sleep apnea (adult) (pediatric): Secondary | ICD-10-CM

## 2021-12-07 DIAGNOSIS — I25110 Atherosclerotic heart disease of native coronary artery with unstable angina pectoris: Secondary | ICD-10-CM | POA: Diagnosis not present

## 2021-12-07 DIAGNOSIS — G40909 Epilepsy, unspecified, not intractable, without status epilepticus: Secondary | ICD-10-CM | POA: Diagnosis not present

## 2021-12-07 NOTE — Progress Notes (Signed)
? ?Established Patient Office Visit ? ?Subjective   ?Patient ID: Mary Fowler, female    DOB: 09-09-1970  Age: 51 y.o. MRN: [id number] ? ?Chief Complaint  ?Patient presents with  ? Hypertension  ? Follow-up  ? ? ?HPI ?[name] with HTN, CAD, HLD, hx of MI, Seizure disorder who presents to the clinic for 6 month follow up.  ? ?Pt is doing great. She is exercising daily and eating MUCH better. She is sleeping well. No seizure activity in last 6 months. No CP, palpitations, headaches or vision changes. She is compliant with all her medications. She continues to not smoke any cigarettes.  ? ?Patient Active Problem List  ? Diagnosis Date Noted  ? Subconjunctival hemorrhage of left eye 09/02/2021  ? Vasovagal syncope 09/02/2021  ? Brain fog 05/09/2021  ? Memory changes 05/09/2021  ? OSA (obstructive sleep apnea) 12/14/2020  ? New onset seizure ([place]) 11/09/2020  ? Coronary artery disease involving native coronary artery of native heart with unstable angina pectoris ([place]) 11/09/2020  ? Excessive daytime sleepiness 11/09/2020  ? ST elevation myocardial infarction involving right coronary artery ([place]) 11/05/2020  ? Seizure disorder ([place]) 11/05/2020  ? Seizure-like activity ([place]) 07/12/2020  ? Current smoker 07/12/2020  ? Stress at work 07/12/2020  ? IDA (iron deficiency anemia) 08/01/2019  ? DUB (dysfunctional uterine bleeding) 07/30/2019  ? Mixed hyperlipidemia 04/05/2012  ? 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 ?Past Medical History:  ?Diagnosis Date  ? Seizures ([place]) 06/13/2020  ? ?Past Surgical History:  ?Procedure Laterality Date  ? OTHER SURGICAL HISTORY    ? stent in heart  ? ?Social History  ? ?Tobacco Use  ? Smoking status: Former  ?  Types: Cigarettes  ?  Quit date: 08/21/2002  ?  Years since quitting: 19.3  ? Smokeless tobacco: Never  ?Vaping Use  ? Vaping Use: Never used  ?Substance Use Topics  ? Alcohol use: Yes  ?  Comment: rare  ? Drug use: No  ? ?Social History  ? ?Socioeconomic History  ?  Marital status: Married  ?  Spouse name: Mary Fowler  ? Number of children: 1  ? Years of education: Not on file  ? Highest education level: Bachelor's degree (e.g., BA, AB, BS)  ?Occupational History  ? Occupation: Teacher, adult education.   ?  Employer: [employer]  ?  Comment: Works on [employer] for [employer].   ?Tobacco Use  ? Smoking status: Former  ?  Types: Cigarettes  ?  Quit date: 08/21/2002  ?  Years since quitting: 19.3  ? Smokeless tobacco: Never  ?Vaping Use  ? Vaping Use: Never used  ?Substance and Sexual Activity  ? Alcohol use: Yes  ?  Comment: rare  ? Drug use: No  ? Sexual activity: Yes  ?  Partners: Male  ?  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  ?Other Topics Concern  ? Not on file  ?Social History Narrative  ? Walks for exercise. Swimming.    ? Lives with husband  ? Caffeine 2 a day  ? ?Social Determinants of Health  ? ?Financial Resource Strain: Not on file  ?Food Insecurity: Not on file  ?Transportation Needs: Not on file  ?Physical Activity: Not on file  ?Stress: Not on file  ?Social Connections: Not on file  ?Intimate Partner Violence: Not on file  ? ?Family Status  ?Relation Name Status  ? Mother  Alive  ? Father  MVA Deceased  ? Sister  Alive  ? MGM  (Not Specified)  ? ?Family History  ?Problem Relation Age of Onset  ? Hypothyroidism Mother   ? Hyperlipidemia Mother   ? Anxiety disorder Sister   ? Diabetes Maternal Grandmother   ? ?Allergies  ?Allergen Reactions  ? Codeine Nausea Only  ? ?  ? ?Review of Systems  ?All other systems reviewed and are negative. ? ?  ?Objective:  ?  ? ?BP 119/69   Pulse 72   Ht '5\' 7"'  (1.702 m)   Wt 152 lb (68.9 kg)   SpO2 100%   BMI 23.81 kg/m?  ?BP Readings from Last 3 Encounters:  ?12/07/21 119/69  ?08/31/21 137/82  ?05/09/21 125/76  ? ?  ? ?Physical Exam ?Vitals reviewed.  ?Constitutional:   ?   Appearance: Normal appearance.  ?HENT:  ?   Head: Normocephalic.  ?Neck:  ?   Vascular: No carotid  bruit.  ?Cardiovascular:  ?   Rate and Rhythm: Normal rate and regular rhythm.  ?   Pulses: Normal pulses.  ?   Heart sounds: Normal heart sounds.  ?Pulmonary:  ?   Effort: Pulmonary effort is normal.  ?   Breath sounds: Normal breath sounds.  ?Musculoskeletal:  ?   Cervical back: Normal range of motion and neck supple.  ?   Right lower leg: No edema.  ?   Left lower leg: No edema.  ?Lymphadenopathy:  ?   Cervical: No cervical adenopathy.  ?Neurological:  ?   General: No focal deficit present.  ?   Mental Status: She is alert and oriented to person, place, and time.  ?Psychiatric:     ?   Mood and Affect: Mood normal.     ?   Behavior: Behavior normal.  ? ? ? ?No results found for any visits on 12/07/21. ? ?Last metabolic panel ?Lab Results  ?Component Value Date  ? GLUCOSE 89 05/09/2021  ? NA 139 05/09/2021  ? K 4.2 05/09/2021  ? CL 104 05/09/2021  ? CO2 27 05/09/2021  ? BUN 11 05/09/2021  ? CREATININE 0.89 05/09/2021  ? EGFR 78 05/09/2021  ? CALCIUM 9.3 05/09/2021  ? PROT 6.7 05/09/2021  ? ALBUMIN 4.5 03/01/2011  ? BILITOT 0.8 05/09/2021  ? ALKPHOS 34 (L) 03/01/2011  ? AST 19 05/09/2021  ? ALT 22 05/09/2021  ? ?Last lipids ?Lab Results  ?Component Value Date  ? CHOL 143 05/09/2021  ? HDL 45 (L) 05/09/2021  ? [place] 82 05/09/2021  ? LDLDIRECT 157 (H) 03/01/2011  ? TRIG 76 05/09/2021  ? CHOLHDL 3.2 05/09/2021  ? ?Last hemoglobin A1c ?Lab Results  ?Component Value Date  ? HGBA1C 5.3 05/09/2021  ? ?Last thyroid functions ?Lab Results  ?Component Value Date  ? TSH 3.20 07/30/2019  ? ?  ? ?The ASCVD Risk score (Arnett DK, et al., 2019) failed to calculate for the following reasons: ?  The patient has a prior MI or stroke diagnosis ? ?  ?Assessment & Plan:  ?..Mary Fowler was seen today for hypertension and follow-up. ? ?Diagnoses and all orders for this visit: ? ?Seizure disorder ([place]) ? ?OSA (obstructive sleep apnea) ? ?Dyslipidemia, goal LDL below 70 ? ?Coronary artery disease involving native coronary artery of native  heart with unstable angina pectoris ([place]) ? ? ?Pt sees neurology for seizure management and keppra refills. ? ?Pt sees cardiology after MI and CAD.  ?Continue on statin with LDL goal under 70  ?Continue on metoprolol  ?Vitals  look great ? ?Continue using CPAP for OsA ? ? ? ?Return in about 6 months (around 06/08/2022) for Needs CPE.  ? ? ?Iran Planas, PA-C ? ?

## 2022-06-09 ENCOUNTER — Ambulatory Visit (INDEPENDENT_AMBULATORY_CARE_PROVIDER_SITE_OTHER): Payer: 59 | Admitting: Physician Assistant

## 2022-06-09 VITALS — BP 118/69 | HR 80 | Ht 67.0 in | Wt 162.0 lb

## 2022-06-09 DIAGNOSIS — D508 Other iron deficiency anemias: Secondary | ICD-10-CM

## 2022-06-09 DIAGNOSIS — E782 Mixed hyperlipidemia: Secondary | ICD-10-CM | POA: Diagnosis not present

## 2022-06-09 DIAGNOSIS — Z1211 Encounter for screening for malignant neoplasm of colon: Secondary | ICD-10-CM

## 2022-06-09 DIAGNOSIS — Z131 Encounter for screening for diabetes mellitus: Secondary | ICD-10-CM

## 2022-06-09 DIAGNOSIS — Z0000 Encounter for general adult medical examination without abnormal findings: Secondary | ICD-10-CM

## 2022-06-09 DIAGNOSIS — Z1329 Encounter for screening for other suspected endocrine disorder: Secondary | ICD-10-CM

## 2022-06-09 DIAGNOSIS — Z1231 Encounter for screening mammogram for malignant neoplasm of breast: Secondary | ICD-10-CM

## 2022-06-09 DIAGNOSIS — E785 Hyperlipidemia, unspecified: Secondary | ICD-10-CM | POA: Diagnosis not present

## 2022-06-09 DIAGNOSIS — K5904 Chronic idiopathic constipation: Secondary | ICD-10-CM

## 2022-06-09 NOTE — Patient Instructions (Signed)

## 2022-06-09 NOTE — Progress Notes (Signed)
Complete physical exam  Patient: Mary Fowler   DOB: 1971/04/17   51 y.o. Female  MRN: [id number]  Subjective:    Chief Complaint  Patient presents with   Annual Exam    Mary Fowler is a 51 y.o. female who presents today for a complete physical exam. She reports consuming a general and she does try to eat a low salt and low fat   diet.  She walks regularly, a few times a week.   She generally feels well. She reports sleeping well. She does not have additional problems to discuss today.    Most recent fall risk assessment:    12/07/2021    8:03 AM  Fall Risk   Falls in the past year? 1  Number falls in past yr: 1  Injury with Fall? 0  Risk for fall due to : History of fall(s)  Follow up Falls evaluation completed     Most recent depression screenings:    06/09/2022    9:04 AM 12/07/2021    8:03 AM  PHQ 2/9 Scores  PHQ - 2 Score 0 0  PHQ- 9 Score 0     Vision:Not within last year  and Dental: No current dental problems and Receives regular dental care  Patient Active Problem List   Diagnosis Date Noted   Dyslipidemia, goal LDL below 70 12/07/2021   Subconjunctival hemorrhage of left eye 09/02/2021   Vasovagal syncope 09/02/2021   Brain fog 05/09/2021   Memory changes 05/09/2021   OSA (obstructive sleep apnea) 12/14/2020   New onset seizure ([place]) 11/09/2020   Coronary artery disease involving native coronary artery of native heart with unstable angina pectoris ([place]) 11/09/2020   Excessive daytime sleepiness 11/09/2020   ST elevation myocardial infarction involving right coronary artery ([place]) 11/05/2020   Seizure disorder ([place]) 11/05/2020   Seizure-like activity ([place]) 07/12/2020   Current smoker 07/12/2020   Stress at work 07/12/2020   IDA (iron deficiency anemia) 08/01/2019   DUB (dysfunctional uterine bleeding) 07/30/2019   Mixed hyperlipidemia 04/05/2012   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   Cholecalciferol 25 MCG (1000 UT) tablet Take by mouth.   ferrous sulfate 325 (65 FE) MG EC tablet Take by mouth.   levETIRAcetam (KEPPRA) 500 MG tablet Take 500 mg by mouth 2 (two) times daily.   metoprolol succinate (TOPROL-XL) 25 MG 24 hr tablet Take 12.5 mg by mouth daily.   nitroGLYCERIN (NITROSTAT) 0.4 MG SL tablet Place under the tongue. (Patient not taking: Reported on 06/09/2022)   No facility-administered medications prior to visit.    Review of Systems  All other systems reviewed and are negative.         Objective:     BP 118/69   Pulse 80   Ht 5\' 7"  (1.702 m)  Wt 162 lb (73.5 kg)   SpO2 100%   BMI 25.37 kg/m  BP Readings from Last 3 Encounters:  06/09/22 118/69  12/07/21 119/69  08/31/21 137/82   Wt Readings from Last 3 Encounters:  06/09/22 162 lb (73.5 kg)  12/07/21 152 lb (68.9 kg)  08/31/21 153 lb 1 oz (69.4 kg)    ..    06/09/2022    9:04 AM 12/07/2021    8:03 AM 05/09/2021    8:38 AM 07/30/2019    9:16 AM  Depression screen PHQ 2/9  Decreased Interest 0 0 0 1  Down, Depressed, Hopeless 0 0 0 0  PHQ - 2 Score 0 0 0 1  Altered sleeping 0  0 1  Tired, decreased energy 0  0 1  Change in appetite 0  0 0  Feeling bad or failure about yourself  0  0 0  Trouble concentrating 0  0 0  Moving slowly or fidgety/restless 0  0 0  Suicidal thoughts 0  0 0  PHQ-9 Score 0  0 3  Difficult doing work/chores Not  difficult at all  Not difficult at all Not difficult at all     Physical Exam  BP 118/69   Pulse 80   Ht 5\' 7"  (1.702 m)   Wt 162 lb (73.5 kg)   SpO2 100%   BMI 25.37 kg/m   General Appearance:    Alert, cooperative, no distress, appears stated age  Head:    Normocephalic, without obvious abnormality, atraumatic  Eyes:    PERRL, conjunctiva/corneas clear, EOM's intact, fundi    benign, both eyes  Ears:    Normal TM's and external ear canals, both ears  Nose:   Nares normal, septum midline, mucosa normal, no drainage    or sinus tenderness  Throat:   Lips, mucosa, and tongue normal; teeth and gums normal  Neck:   Supple, symmetrical, trachea midline, no adenopathy;    thyroid:  no enlargement/tenderness/nodules; no carotid   bruit or JVD  Back:     Symmetric, no curvature, ROM normal, no CVA tenderness  Lungs:     Clear to auscultation bilaterally, respirations unlabored  Chest Wall:    No tenderness or deformity   Heart:    Regular rate and rhythm, S1 and S2 normal, no murmur, rub   or gallop     Abdomen:     Soft, non-tender, bowel sounds active all four quadrants,    no masses, no organomegaly        Extremities:   Extremities normal, atraumatic, no cyanosis or edema  Pulses:   2+ and symmetric all extremities  Skin:   Skin color, texture, turgor normal, no rashes or lesions  Lymph nodes:   Cervical, supraclavicular, and axillary nodes normal  Neurologic:   CNII-XII intact, normal strength, sensation and reflexes    throughout        Assessment & Plan:    Routine Health Maintenance and Physical Exam  Immunization History  Administered Date(s) Administered   PFIZER(Purple Top)SARS-COV-2 Vaccination 10/20/2019, 11/10/2019   Td 08/21/2001    Health Maintenance  Topic Date Due   MAMMOGRAM  08/13/2021   COVID-19 Vaccine (3 - Pfizer risk series) 06/25/2022 (Originally 12/08/2019)   Zoster Vaccines- Shingrix (1 of 2) 09/09/2022 (Originally 05/25/1988)   INFLUENZA  VACCINE  11/19/2022 (Originally 03/21/2022)   COLONOSCOPY (Pts 45-81yrs Insurance coverage will need to be confirmed)  12/08/2022 (Originally 05/25/2014)   HIV Screening  12/08/2022 (Originally 05/25/1984)  TETANUS/TDAP  06/10/2023 (Originally 04/05/2022)   PAP SMEAR-Modifier  07/29/2024   Hepatitis C Screening  Completed   HPV VACCINES  Aged Out    Discussed health benefits of physical activity, and encouraged her to engage in regular exercise appropriate for her age and condition. Marland KitchenInetta Fermo was seen today for annual exam.  Diagnoses and all orders for this visit:  Annual physical exam -     TSH -     Lipid Panel w/reflex Direct LDL -     COMPLETE METABOLIC PANEL WITH GFR -     CBC with Differential/Platelet  Dyslipidemia, goal LDL below 70 -     Lipid Panel w/reflex Direct LDL  Mixed hyperlipidemia -     Lipid Panel w/reflex Direct LDL  Iron deficiency anemia secondary to inadequate dietary iron intake -     CBC with Differential/Platelet  Screening for diabetes mellitus -     COMPLETE METABOLIC PANEL WITH GFR  Thyroid disorder screen -     TSH  Encounter for screening mammogram for malignant neoplasm of breast -     MM 3D SCREEN BREAST BILATERAL  Colon cancer screening -     Cologuard  Chronic idiopathic constipation -     Cologuard   .[name] Discussed 150 minutes of exercise a week.  Encouraged vitamin D 1000 units and Calcium 1300mg  or 4 servings of dairy a day.  PHQ no concerns Fasting labs ordered today Cologuard reordered Discussed probiotics and diet for constipation Consider miralax as needed Pap UTD Mammogram ordered today Declined shingles/flu/covid vaccine. Follow up in 1 year          , Tandy Gaw

## 2022-06-10 LAB — COMPLETE METABOLIC PANEL WITH GFR
AG Ratio: 1.8 (calc) (ref 1.0–2.5)
ALT: 26 U/L (ref 6–29)
AST: 21 U/L (ref 10–35)
Albumin: 4.2 g/dL (ref 3.6–5.1)
Alkaline phosphatase (APISO): 56 U/L (ref 37–153)
BUN: 14 mg/dL (ref 7–25)
CO2: 26 mmol/L (ref 20–32)
Calcium: 9.2 mg/dL (ref 8.6–10.4)
Chloride: 105 mmol/L (ref 98–110)
Creat: 0.85 mg/dL (ref 0.50–1.03)
Globulin: 2.4 g/dL (calc) (ref 1.9–3.7)
Glucose, Bld: 91 mg/dL (ref 65–99)
Potassium: 4.2 mmol/L (ref 3.5–5.3)
Sodium: 138 mmol/L (ref 135–146)
Total Bilirubin: 0.7 mg/dL (ref 0.2–1.2)
Total Protein: 6.6 g/dL (ref 6.1–8.1)
eGFR: 82 mL/min/{1.73_m2} (ref >=60)

## 2022-06-10 LAB — CBC WITH DIFFERENTIAL/PLATELET
Absolute Monocytes: 656 cells/uL (ref 200–950)
Basophils Absolute: 81 cells/uL (ref 0–200)
Basophils Relative: 1 %
Eosinophils Absolute: 324 cells/uL (ref 15–500)
Eosinophils Relative: 4 %
HCT: 45.3 % — ABNORMAL HIGH (ref 35.0–45.0)
Hemoglobin: 14.9 g/dL (ref 11.7–15.5)
Lymphs Abs: 1523 cells/uL (ref 850–3900)
MCH: 31.1 pg (ref 27.0–33.0)
MCHC: 32.9 g/dL (ref 32.0–36.0)
MCV: 94.6 fL (ref 80.0–100.0)
MPV: 14.2 fL — ABNORMAL HIGH (ref 7.5–12.5)
Monocytes Relative: 8.1 %
Neutro Abs: 5516 cells/uL (ref 1500–7800)
Neutrophils Relative %: 68.1 %
Platelets: 117 10*3/uL — ABNORMAL LOW (ref 140–400)
RBC: 4.79 10*6/uL (ref 3.80–5.10)
RDW: 12 % (ref 11.0–15.0)
Total Lymphocyte: 18.8 %
WBC: 8.1 10*3/uL (ref 3.8–10.8)

## 2022-06-10 LAB — LIPID PANEL W/REFLEX DIRECT LDL
Cholesterol: 121 mg/dL (ref <200)
HDL: 44 mg/dL — ABNORMAL LOW (ref >=50)
LDL Cholesterol (Calc): 64 mg/dL (calc)
Non-HDL Cholesterol (Calc): 77 mg/dL (calc) (ref <130)
Total CHOL/HDL Ratio: 2.8 (calc) (ref <5.0)
Triglycerides: 51 mg/dL (ref <150)

## 2022-06-10 LAB — TSH: TSH: 2.76 mIU/L

## 2022-06-12 NOTE — Progress Notes (Signed)
Karaline,   LDL looks great.  HDL could be higher. More exercise and eating good fats.  Thyroid looks good.  Kidney, liver, glucose looks good.  Platelets on the low side.

## 2022-06-28 ENCOUNTER — Ambulatory Visit (INDEPENDENT_AMBULATORY_CARE_PROVIDER_SITE_OTHER): Payer: 59

## 2022-06-28 DIAGNOSIS — Z1231 Encounter for screening mammogram for malignant neoplasm of breast: Secondary | ICD-10-CM | POA: Diagnosis not present

## 2022-06-30 NOTE — Progress Notes (Signed)
Needs more imaging for the left breast.

## 2022-07-03 ENCOUNTER — Other Ambulatory Visit: Payer: Self-pay | Admitting: Physician Assistant

## 2022-07-03 DIAGNOSIS — R928 Other abnormal and inconclusive findings on diagnostic imaging of breast: Secondary | ICD-10-CM

## 2022-07-21 ENCOUNTER — Ambulatory Visit
Admission: RE | Admit: 2022-07-21 | Discharge: 2022-07-21 | Disposition: A | Discharge status: Home / Self Care | Payer: 59 | Source: Ambulatory Visit | Attending: Physician Assistant | Admitting: Physician Assistant

## 2022-07-21 ENCOUNTER — Ambulatory Visit: Payer: 59

## 2022-07-21 DIAGNOSIS — R928 Other abnormal and inconclusive findings on diagnostic imaging of breast: Secondary | ICD-10-CM

## 2023-02-13 ENCOUNTER — Telehealth: Payer: Self-pay | Admitting: Physician Assistant

## 2023-02-13 NOTE — Telephone Encounter (Signed)
Patient called requesting a refill of atorvastatin (LIPITOR) 80 MG tablet [[id number]]   Pharmacy ;  [facility] in [place]

## 2023-02-14 MED ORDER — ATORVASTATIN CALCIUM 80 MG PO TABS: 80.0000 mg | ORAL_TABLET | Freq: Every day | ORAL | 3 refills | Status: AC

## 2023-02-15 NOTE — Telephone Encounter (Signed)
Patient aware.

## 2023-07-02 ENCOUNTER — Other Ambulatory Visit: Payer: Self-pay | Admitting: Physician Assistant

## 2023-07-02 DIAGNOSIS — R928 Other abnormal and inconclusive findings on diagnostic imaging of breast: Secondary | ICD-10-CM

## 2024-01-24 ENCOUNTER — Other Ambulatory Visit: Payer: Self-pay | Admitting: Physician Assistant

## 2024-01-24 NOTE — Telephone Encounter (Signed)
 Pls contact pt to schedule appt for medication refills with [facility]. Thx.
